# Patient Record
Sex: Female | Born: 1975 | Race: White | Hispanic: No | Smoking: Never smoker
Health system: Southern US, Community
[De-identification: ages and names within clinical notes are randomized; demographics above are authoritative.]

## PROBLEM LIST (undated history)

## (undated) DIAGNOSIS — Z8742 Personal history of other diseases of the female genital tract: Secondary | ICD-10-CM

## (undated) DIAGNOSIS — R112 Nausea with vomiting, unspecified: Secondary | ICD-10-CM

## (undated) DIAGNOSIS — I10 Essential (primary) hypertension: Secondary | ICD-10-CM

## (undated) DIAGNOSIS — Z9889 Other specified postprocedural states: Secondary | ICD-10-CM

## (undated) DIAGNOSIS — E669 Obesity, unspecified: Secondary | ICD-10-CM

## (undated) DIAGNOSIS — F419 Anxiety disorder, unspecified: Secondary | ICD-10-CM

## (undated) DIAGNOSIS — K644 Residual hemorrhoidal skin tags: Secondary | ICD-10-CM

## (undated) HISTORY — PX: MM BREAST STEREO BIOPSY LEFT (ARMC HX): HXRAD1824

## (undated) HISTORY — DX: Residual hemorrhoidal skin tags: K64.4

## (undated) HISTORY — PX: BREAST BIOPSY: SHX20

## (undated) HISTORY — DX: Essential (primary) hypertension: I10

---

## 2012-12-02 ENCOUNTER — Inpatient Hospital Stay: Payer: Self-pay | Admitting: Obstetrics & Gynecology

## 2012-12-02 LAB — CBC WITH DIFFERENTIAL/PLATELET
Basophil #: 0 10*3/uL (ref 0.0–0.1)
Eosinophil #: 0 10*3/uL (ref 0.0–0.7)
Eosinophil %: 0.3 %
HCT: 35.9 % (ref 35.0–47.0)
Lymphocyte #: 2.6 10*3/uL (ref 1.0–3.6)
Lymphocyte %: 17.6 %
MCH: 28.9 pg (ref 26.0–34.0)
MCHC: 33.1 g/dL (ref 32.0–36.0)
Neutrophil #: 11.4 10*3/uL — ABNORMAL HIGH (ref 1.4–6.5)
Platelet: 236 10*3/uL (ref 150–440)
RBC: 4.1 10*6/uL (ref 3.80–5.20)
RDW: 13.8 % (ref 11.5–14.5)
WBC: 14.8 10*3/uL — ABNORMAL HIGH (ref 3.6–11.0)

## 2012-12-03 LAB — HEMATOCRIT: HCT: 30.9 % — ABNORMAL LOW (ref 35.0–47.0)

## 2012-12-10 LAB — PATHOLOGY REPORT

## 2013-05-24 ENCOUNTER — Emergency Department: Payer: Self-pay | Admitting: Emergency Medicine

## 2013-05-24 LAB — URINALYSIS, COMPLETE
Bacteria: NONE SEEN
Bilirubin,UR: NEGATIVE
Blood: NEGATIVE
Glucose,UR: NEGATIVE mg/dL
Ketone: NEGATIVE
Leukocyte Esterase: NEGATIVE
Nitrite: NEGATIVE
Ph: 5
Protein: NEGATIVE
RBC,UR: 2 /HPF
Specific Gravity: 1.02
Squamous Epithelial: 4
WBC UR: 2 /HPF

## 2013-05-24 LAB — BASIC METABOLIC PANEL
Anion Gap: 11 (ref 7–16)
BUN: 12 mg/dL (ref 7–18)
Calcium, Total: 9.7 mg/dL (ref 8.5–10.1)
Co2: 23 mmol/L (ref 21–32)
EGFR (African American): >60
Glucose: 116 mg/dL — ABNORMAL HIGH (ref 65–99)
Osmolality: 280 (ref 275–301)
Potassium: 4.7 mmol/L (ref 3.5–5.1)

## 2013-05-24 LAB — CBC WITH DIFFERENTIAL/PLATELET
Basophil #: 0.1 x10 3/mm 3
Basophil %: 0.5 %
Eosinophil #: 0.1 x10 3/mm 3
Eosinophil %: 0.6 %
HCT: 43.2 %
HGB: 14.3 g/dL
Lymphocyte %: 38.8 %
Lymphs Abs: 5 x10 3/mm 3 — ABNORMAL HIGH
MCH: 28.1 pg
MCHC: 33.2 g/dL
MCV: 85 fL
Monocyte #: 0.7 "x10 3/mm "
Monocyte %: 5.3 %
Neutrophil #: 7.1 x10 3/mm 3 — ABNORMAL HIGH
Neutrophil %: 54.8 %
Platelet: 295 x10 3/mm 3
RBC: 5.1 X10 6/mm 3
RDW: 13.6 %
WBC: 13 x10 3/mm 3 — ABNORMAL HIGH

## 2013-05-24 LAB — HCG, QUANTITATIVE, PREGNANCY: Beta Hcg, Quant.: <1 m[IU]/mL — ABNORMAL LOW

## 2014-11-26 ENCOUNTER — Ambulatory Visit: Payer: Self-pay | Admitting: Family Medicine

## 2015-02-18 NOTE — Op Note (Signed)
PATIENT NAME:  Tonya Castro, Tonya Castro MR#:  [id number] DATE OF BIRTH:  1976/03/11  DATE OF PROCEDURE:  12/02/2012  PREOPERATIVE DIAGNOSIS:  Term intrauterine pregnancy, prior history of cesarean section, labor.   POSTOPERATIVE DIAGNOSIS:  Term intrauterine pregnancy, prior history of cesarean section, labor.  Also, abnormal right thigh nevus.   PROCEDURE:  Repeat cesarean section, excision of right thigh nevus.   SURGEON:  Annamarie MajorPaul Daenerys Buttram, M.D.   ASSISTANT:  Jean RosenthalJackson.   ANESTHESIA:  Spinal.   ESTIMATED BLOOD LOSS:  250 mL.   COMPLICATIONS:  None.   FINDINGS:  Normal tubes, ovaries and uterus, although there was adhesions in the pelvis.  There was the delivery of a viable female infant weighing 6 pounds 1 ounce with Apgar scores of 9 and 9 at 1 and 5 minutes respectively.   DISPOSITION:  To recovery room in stable condition.   TECHNIQUE:  The patient is prepped and draped in the usual sterile fashion after adequate anesthesia is obtained in the supine position on the operating room table.  A scalpel is used to create a low transverse skin incision through the area of the prior scar.  This is extended down to the level of the rectus fascia.  The rectus fascia is dissected bilaterally using Mayo scissors.  Rectus muscles are separated in the midline.  The peritoneum is penetrated although there is significant adhesions that make it difficult to develop any internal dissection or removed the uterus after delivering the baby.  The bladder is inferiorly retracted and dissected to create a flap and then a scalpel was used to create a hysterotomy incision that is then extended by blunt dissection.  The amniotomy at that point then reveals clear fluid.  The infant's head is grasped and delivered without complication and without the use of a vacuum device.  The oropharynx is suctioned and the remaining portion of the infant is delivered with clamping and cutting of the umbilical cord.   Cord blood is obtained and  the placenta is manually extracted.  The uterus is unable to be externalized.  The hysterotomy incision is closed with a running #1 Vicryl suture in a locking fashion with excellent hemostasis noted.  The area is irrigated with saline with aspiration of all fluid.  The peritoneum is closed with a Vicryl suture.  The muscles are plicated.  The On-Q device is placed in the subfascial space.  Trocars were placed through the abdomen into the subfascial space with threading of the silver soaker catheters.  These are then placed in subfascial space.  The rectus fascia is closed with 0 Maxon suture without complication and without incorporating the catheters into the incision.  Subcutaneous tissues are irrigated with warm saline and hemostasis was assured using electrocautery.  Skin is closed with a dissolvable staple and Dermabond is applied.  The catheters were stabilized with Dermabond and Steri-Strips and are flushed with 5 mL each of bupivacaine.   The right nevus is identified and grasped with an Allis clamp.  Bupivacaine is used to anesthetize the base.  Scalpel used to excise the nevus.  Hemostasis assured using electrocautery.  Defect is closed with Dermabond.  The patient goes to recovery room in stable condition.  All sponge, instrument and needle counts are correct.    ____________________________ R. Annamarie MajorPaul Mazal Ebey, MD rph:ea D: 12/02/2012 16:06:15 ET T: 12/02/2012 23:43:31 ET JOB#: [id number]  cc: Dierdre Searles. Paul Senay Sistrunk, MD, <Dictator> Nadara MustardOBERT P Allysha Tryon MD ELECTRONICALLY SIGNED 12/03/2012 3:28

## 2015-03-08 NOTE — H&P (Signed)
L&D Evaluation:  History Expanded:   HPI 39 yo G2P1 at 39 weeks in labor w bloody show and contractions since 0300. Sch for Cesarean Section Clovis Caohur as she has had prior Cesarean Section for Failure To Progress. [facility]    Gravida 2    Term 1    Blood Type (Maternal) O positive    Group B Strep Results Maternal (Result >5wks must be treated as unknown) negative    Maternal HIV Negative    Maternal Syphilis Ab Nonreactive    Maternal Varicella Immune    Rubella Results (Maternal) nonimmune    [facility]    Presents with contractions    Patient's Medical History No Chronic Illness  Obesity.    Patient's Surgical History Previous C-Section    Medications Pre Natal Vitamins    Allergies PCN, Vancomycin    Social History none    Family History Non-Contributory   ROS:   ROS All systems were reviewed.  HEENT, CNS, GI, GU, Respiratory, CV, Renal and Musculoskeletal systems were found to be normal.   Exam:   Vital Signs stable    General no apparent distress    Mental Status clear    Chest clear    Heart normal sinus rhythm    Abdomen gravid, tender with contractions    Estimated Fetal Weight Average for gestational age    Back no CVAT    Edema no edema    Reflexes 2+    Pelvic no external lesions, 3/50/-2    Mebranes Intact    FHT normal rate with no decels    Ucx regular    Skin dry   Impression:   Impression early labor, w prior Cesarean Section, desires Repeat Cesarean Section.   Plan:   Plan EFM/NST, fluids    Comments Plan Cesarean Section today, as she is in early labor and there would be no perceived benefit to waiting until Thursday (2 days from now).  Pros and cons discussed.  Pt has been fully informed of the pros and cons, risk/benefits of repeat Cesarean section versus VBAC.  The risks of a repeat Cesarean section include all the risks of major surgery, such as the risk of anesthesia,  hemorrhage, infection, injury to adjacent organs-bowel, bladder and blood vessels.  The risks of attempting a VBAC include a 1 in 100 risk of uterine rupture with resultant potentially severe fetal/maternal complications and an increased risk of Cesarean complications should she not be successful. After carefully weighing the pros and cons, the patient strongly requests a repeat Cesarean section.   Electronic Signatures: Letitia LibraHarris, Robert Paul (MD)  (Signed 04-Feb-14 12:06)  Authored: L&D Evaluation   Last Updated: 04-Feb-14 12:06 by Letitia LibraHarris, Robert Paul (MD)

## 2015-03-14 ENCOUNTER — Observation Stay
Admission: EM | Admit: 2015-03-14 | Discharge: 2015-03-15 | Disposition: A | Discharge status: Home / Self Care | Payer: BC Managed Care – PPO | Attending: Obstetrics and Gynecology | Admitting: Obstetrics and Gynecology

## 2015-03-14 ENCOUNTER — Observation Stay: Payer: BC Managed Care – PPO | Admitting: Registered Nurse

## 2015-03-14 ENCOUNTER — Emergency Department: Payer: BC Managed Care – PPO

## 2015-03-14 ENCOUNTER — Encounter
Admission: EM | Disposition: A | Discharge status: Home / Self Care | Payer: Self-pay | Source: Home / Self Care | Attending: Emergency Medicine

## 2015-03-14 ENCOUNTER — Encounter: Payer: Self-pay | Admitting: Radiology

## 2015-03-14 DIAGNOSIS — T839XXA Unspecified complication of genitourinary prosthetic device, implant and graft, initial encounter: Secondary | ICD-10-CM

## 2015-03-14 DIAGNOSIS — T8332XA Displacement of intrauterine contraceptive device, initial encounter: Secondary | ICD-10-CM | POA: Insufficient documentation

## 2015-03-14 DIAGNOSIS — Z9889 Other specified postprocedural states: Secondary | ICD-10-CM | POA: Diagnosis not present

## 2015-03-14 DIAGNOSIS — R1030 Lower abdominal pain, unspecified: Secondary | ICD-10-CM | POA: Insufficient documentation

## 2015-03-14 DIAGNOSIS — F419 Anxiety disorder, unspecified: Secondary | ICD-10-CM | POA: Diagnosis not present

## 2015-03-14 DIAGNOSIS — Z302 Encounter for sterilization: Secondary | ICD-10-CM | POA: Diagnosis not present

## 2015-03-14 DIAGNOSIS — Z6835 Body mass index (BMI) 35.0-35.9, adult: Secondary | ICD-10-CM | POA: Insufficient documentation

## 2015-03-14 DIAGNOSIS — Z79899 Other long term (current) drug therapy: Secondary | ICD-10-CM | POA: Diagnosis not present

## 2015-03-14 DIAGNOSIS — X58XXXA Exposure to other specified factors, initial encounter: Secondary | ICD-10-CM | POA: Diagnosis not present

## 2015-03-14 DIAGNOSIS — K760 Fatty (change of) liver, not elsewhere classified: Secondary | ICD-10-CM | POA: Insufficient documentation

## 2015-03-14 DIAGNOSIS — R109 Unspecified abdominal pain: Secondary | ICD-10-CM | POA: Diagnosis present

## 2015-03-14 DIAGNOSIS — E669 Obesity, unspecified: Secondary | ICD-10-CM

## 2015-03-14 DIAGNOSIS — Z881 Allergy status to other antibiotic agents status: Secondary | ICD-10-CM | POA: Insufficient documentation

## 2015-03-14 DIAGNOSIS — R102 Pelvic and perineal pain: Principal | ICD-10-CM | POA: Diagnosis present

## 2015-03-14 DIAGNOSIS — T8389XA Other specified complication of genitourinary prosthetic devices, implants and grafts, initial encounter: Secondary | ICD-10-CM | POA: Diagnosis present

## 2015-03-14 HISTORY — PX: CYSTOSCOPY: SHX5120

## 2015-03-14 HISTORY — PX: LAPAROSCOPY: SHX197

## 2015-03-14 HISTORY — PX: TUBAL LIGATION: SHX77

## 2015-03-14 HISTORY — DX: Anxiety disorder, unspecified: F41.9

## 2015-03-14 HISTORY — PX: HYSTEROSCOPY: SHX211

## 2015-03-14 HISTORY — DX: Obesity, unspecified: E66.9

## 2015-03-14 HISTORY — PX: IUD REMOVAL: SHX5392

## 2015-03-14 LAB — URINALYSIS COMPLETE WITH MICROSCOPIC (ARMC ONLY)
Bacteria, UA: NONE SEEN
Bilirubin Urine: NEGATIVE
GLUCOSE, UA: NEGATIVE mg/dL
Ketones, ur: NEGATIVE mg/dL
LEUKOCYTES UA: NEGATIVE
NITRITE: NEGATIVE
PROTEIN: NEGATIVE mg/dL
SPECIFIC GRAVITY, URINE: 1.02 (ref 1.005–1.030)
pH: 6 (ref 5.0–8.0)

## 2015-03-14 LAB — CBC WITH DIFFERENTIAL/PLATELET
Basophils Absolute: 0 10*3/uL (ref 0–0.1)
Basophils Relative: 0 %
Eosinophils Absolute: 0.1 10*3/uL (ref 0–0.7)
Eosinophils Relative: 0 %
HCT: 42.7 % (ref 35.0–47.0)
Hemoglobin: 14.1 g/dL (ref 12.0–16.0)
Lymphocytes Relative: 11 %
Lymphs Abs: 2.2 10*3/uL (ref 1.0–3.6)
MCH: 29.8 pg (ref 26.0–34.0)
MCHC: 33 g/dL (ref 32.0–36.0)
MCV: 90.3 fL (ref 80.0–100.0)
Monocytes Absolute: 0.9 10*3/uL (ref 0.2–0.9)
Monocytes Relative: 4 %
Neutro Abs: 17.1 10*3/uL — ABNORMAL HIGH (ref 1.4–6.5)
Neutrophils Relative %: 85 %
Platelets: 276 10*3/uL (ref 150–440)
RBC: 4.73 MIL/uL (ref 3.80–5.20)
RDW: 12.6 % (ref 11.5–14.5)
WBC: 20.2 10*3/uL — ABNORMAL HIGH (ref 3.6–11.0)

## 2015-03-14 LAB — BASIC METABOLIC PANEL
Anion gap: 7 (ref 5–15)
BUN: 13 mg/dL (ref 6–20)
CO2: 24 mmol/L (ref 22–32)
Calcium: 9.1 mg/dL (ref 8.9–10.3)
Chloride: 107 mmol/L (ref 101–111)
Creatinine, Ser: 0.81 mg/dL (ref 0.44–1.00)
GFR calc Af Amer: >60 mL/min (ref >60)
GFR calc non Af Amer: >60 mL/min (ref >60)
Glucose, Bld: 112 mg/dL — ABNORMAL HIGH (ref 65–99)
Potassium: 3.9 mmol/L (ref 3.5–5.1)
Sodium: 138 mmol/L (ref 135–145)

## 2015-03-14 LAB — POCT PREGNANCY, URINE: Preg Test, Ur: NEGATIVE

## 2015-03-14 LAB — WET PREP, GENITAL
Clue Cells Wet Prep HPF POC: NONE SEEN
Trich, Wet Prep: NONE SEEN
Yeast Wet Prep HPF POC: NONE SEEN

## 2015-03-14 SURGERY — LAPAROSCOPY, DIAGNOSTIC
Anesthesia: General

## 2015-03-14 MED ORDER — ACETAMINOPHEN 10 MG/ML IV SOLN
INTRAVENOUS | Status: AC
  Filled 2015-03-14: qty 100

## 2015-03-14 MED ORDER — ONDANSETRON HCL 4 MG PO TABS: 4.0000 mg | ORAL_TABLET | Freq: Four times a day (QID) | ORAL | Status: DC | PRN

## 2015-03-14 MED ORDER — ONDANSETRON HCL 4 MG/2ML IJ SOLN
INTRAMUSCULAR | Status: DC | PRN
  Administered 2015-03-14: 4 mg via INTRAVENOUS

## 2015-03-14 MED ORDER — ACETAMINOPHEN 10 MG/ML IV SOLN
INTRAVENOUS | Status: DC | PRN
  Administered 2015-03-14: 1000 mg via INTRAVENOUS

## 2015-03-14 MED ORDER — FENTANYL CITRATE (PF) 100 MCG/2ML IJ SOLN
INTRAMUSCULAR | Status: DC | PRN
  Administered 2015-03-14: 100 ug via INTRAVENOUS
  Administered 2015-03-14: 50 ug via INTRAVENOUS

## 2015-03-14 MED ORDER — ONDANSETRON HCL 4 MG/2ML IJ SOLN: 4.0000 mg | Freq: Four times a day (QID) | INTRAMUSCULAR | Status: DC | PRN

## 2015-03-14 MED ORDER — DEXAMETHASONE SODIUM PHOSPHATE 4 MG/ML IJ SOLN
INTRAMUSCULAR | Status: DC | PRN
  Administered 2015-03-14: 10 mg via INTRAVENOUS

## 2015-03-14 MED ORDER — BUPIVACAINE HCL (PF) 0.5 % IJ SOLN
INTRAMUSCULAR | Status: AC
  Filled 2015-03-14: qty 30

## 2015-03-14 MED ORDER — ROCURONIUM BROMIDE 100 MG/10ML IV SOLN
INTRAVENOUS | Status: DC | PRN
  Administered 2015-03-14: 40 mg via INTRAVENOUS

## 2015-03-14 MED ORDER — HYDROMORPHONE HCL 1 MG/ML IJ SOLN
0.2500 mg | INTRAMUSCULAR | Status: DC | PRN
  Administered 2015-03-14 (×5): 0.25 mg via INTRAVENOUS

## 2015-03-14 MED ORDER — NEOSTIGMINE METHYLSULFATE 10 MG/10ML IV SOLN
INTRAVENOUS | Status: DC | PRN
  Administered 2015-03-14: 5 mg via INTRAVENOUS

## 2015-03-14 MED ORDER — FENTANYL CITRATE (PF) 100 MCG/2ML IJ SOLN
INTRAMUSCULAR | Status: AC
  Administered 2015-03-14: 25 ug via INTRAVENOUS
  Filled 2015-03-14: qty 2

## 2015-03-14 MED ORDER — SODIUM CHLORIDE 0.9 % IJ SOLN: 9.0000 mL | INTRAMUSCULAR | Status: DC | PRN

## 2015-03-14 MED ORDER — FENTANYL CITRATE (PF) 100 MCG/2ML IJ SOLN
25.0000 ug | INTRAMUSCULAR | Status: DC | PRN
  Administered 2015-03-14 (×4): 25 ug via INTRAVENOUS

## 2015-03-14 MED ORDER — MIDAZOLAM HCL 2 MG/2ML IJ SOLN
INTRAMUSCULAR | Status: DC | PRN
  Administered 2015-03-14: 2 mg via INTRAVENOUS

## 2015-03-14 MED ORDER — PHENYLEPHRINE HCL 10 MG/ML IJ SOLN
INTRAMUSCULAR | Status: DC | PRN
  Administered 2015-03-14 (×2): 100 ug via INTRAVENOUS

## 2015-03-14 MED ORDER — OXYCODONE-ACETAMINOPHEN 5-325 MG PO TABS
1.0000 | ORAL_TABLET | ORAL | Status: DC | PRN
  Administered 2015-03-14 – 2015-03-15 (×4): 2 via ORAL
  Filled 2015-03-14 (×4): qty 2

## 2015-03-14 MED ORDER — MORPHINE SULFATE 4 MG/ML IJ SOLN
INTRAMUSCULAR | Status: AC
  Administered 2015-03-14: 4 mg via INTRAVENOUS
  Filled 2015-03-14: qty 1

## 2015-03-14 MED ORDER — ONDANSETRON HCL 4 MG/2ML IJ SOLN: 4.0000 mg | Freq: Once | INTRAMUSCULAR | Status: DC | PRN

## 2015-03-14 MED ORDER — HYDROMORPHONE HCL 1 MG/ML IJ SOLN
INTRAMUSCULAR | Status: AC
  Administered 2015-03-14: 0.25 mg via INTRAVENOUS
  Filled 2015-03-14: qty 1

## 2015-03-14 MED ORDER — DEXTROSE-NACL 5-0.45 % IV SOLN
INTRAVENOUS | Status: DC
  Administered 2015-03-14: 10:00:00 via INTRAVENOUS

## 2015-03-14 MED ORDER — IOHEXOL 300 MG/ML  SOLN
100.0000 mL | Freq: Once | INTRAMUSCULAR | Status: AC | PRN
  Administered 2015-03-14: 100 mL via INTRAVENOUS

## 2015-03-14 MED ORDER — SODIUM CHLORIDE 0.9 % IV BOLUS (SEPSIS)
1000.0000 mL | Freq: Once | INTRAVENOUS | Status: AC
  Administered 2015-03-14: 1000 mL via INTRAVENOUS

## 2015-03-14 MED ORDER — SODIUM CHLORIDE 0.9 % IR SOLN
Status: DC | PRN
  Administered 2015-03-14: 500 mL

## 2015-03-14 MED ORDER — PROPOFOL 10 MG/ML IV BOLUS
INTRAVENOUS | Status: DC | PRN
  Administered 2015-03-14: 200 mg via INTRAVENOUS

## 2015-03-14 MED ORDER — MORPHINE SULFATE 4 MG/ML IJ SOLN
4.0000 mg | Freq: Once | INTRAMUSCULAR | Status: AC
  Administered 2015-03-14: 4 mg via INTRAVENOUS

## 2015-03-14 MED ORDER — GLYCOPYRROLATE 0.2 MG/ML IJ SOLN
INTRAMUSCULAR | Status: DC | PRN
  Administered 2015-03-14: .8 mg via INTRAVENOUS

## 2015-03-14 MED ORDER — LIDOCAINE HCL (CARDIAC) 20 MG/ML IV SOLN
INTRAVENOUS | Status: DC | PRN
  Administered 2015-03-14: 100 mg via INTRAVENOUS

## 2015-03-14 MED ORDER — MORPHINE SULFATE (PF) 1 MG/ML IV SOLN
INTRAVENOUS | Status: DC
  Administered 2015-03-14: 10:00:00 via INTRAVENOUS
  Filled 2015-03-14: qty 25

## 2015-03-14 MED ORDER — LACTATED RINGERS IV SOLN
INTRAVENOUS | Status: DC | PRN
  Administered 2015-03-14: 13:00:00 via INTRAVENOUS

## 2015-03-14 MED ORDER — LEVOFLOXACIN IN D5W 500 MG/100ML IV SOLN
500.0000 mg | INTRAVENOUS | Status: DC
  Filled 2015-03-14 (×2): qty 100

## 2015-03-14 MED ORDER — LEVOFLOXACIN IN D5W 500 MG/100ML IV SOLN
INTRAVENOUS | Status: AC
  Administered 2015-03-14: 500 mg via INTRAVENOUS
  Filled 2015-03-14: qty 100

## 2015-03-14 MED ORDER — BUPIVACAINE HCL 0.5 % IJ SOLN
INTRAMUSCULAR | Status: DC | PRN
  Administered 2015-03-14: 15 mL

## 2015-03-14 MED ORDER — NALOXONE HCL 0.4 MG/ML IJ SOLN: 0.4000 mg | INTRAMUSCULAR | Status: DC | PRN

## 2015-03-14 MED ORDER — DIPHENHYDRAMINE HCL 12.5 MG/5ML PO ELIX
12.5000 mg | ORAL_SOLUTION | Freq: Four times a day (QID) | ORAL | Status: DC | PRN
  Filled 2015-03-14: qty 5

## 2015-03-14 MED ORDER — MORPHINE SULFATE 2 MG/ML IJ SOLN
2.0000 mg | Freq: Once | INTRAMUSCULAR | Status: AC
  Administered 2015-03-14: 2 mg via INTRAVENOUS

## 2015-03-14 MED ORDER — DIPHENHYDRAMINE HCL 50 MG/ML IJ SOLN: 12.5000 mg | Freq: Four times a day (QID) | INTRAMUSCULAR | Status: DC | PRN

## 2015-03-14 MED ORDER — HYDROMORPHONE HCL 1 MG/ML IJ SOLN
1.0000 mg | INTRAMUSCULAR | Status: DC | PRN
  Administered 2015-03-14: 1 mg via INTRAVENOUS
  Filled 2015-03-14: qty 1

## 2015-03-14 MED ORDER — IBUPROFEN 600 MG PO TABS
600.0000 mg | ORAL_TABLET | Freq: Four times a day (QID) | ORAL | Status: DC | PRN
  Administered 2015-03-14 – 2015-03-15 (×2): 600 mg via ORAL
  Filled 2015-03-14 (×2): qty 1

## 2015-03-14 MED ORDER — IOHEXOL 240 MG/ML SOLN
25.0000 mL | Freq: Once | INTRAMUSCULAR | Status: AC | PRN
  Administered 2015-03-14: 25 mL via ORAL

## 2015-03-14 MED ORDER — MORPHINE SULFATE 2 MG/ML IJ SOLN
INTRAMUSCULAR | Status: AC
  Filled 2015-03-14: qty 1

## 2015-03-14 SURGICAL SUPPLY — 41 items
BLADE SURG SZ11 CARB STEEL (BLADE) ×4 IMPLANT
CANISTER SUCT 1200ML W/VALVE (MISCELLANEOUS) ×4 IMPLANT
CHLORAPREP W/TINT 26ML (MISCELLANEOUS) ×4 IMPLANT
CLIP FILSHIE TUBAL LIGA STRL (Clip) ×8 IMPLANT
DERMABOND ADVANCED (GAUZE/BANDAGES/DRESSINGS) ×2
DERMABOND ADVANCED .7 DNX12 (GAUZE/BANDAGES/DRESSINGS) ×2 IMPLANT
DRAPE LEGGINS SURG 28X43 STRL (DRAPES) ×4 IMPLANT
DRAPE SHEET LG 3/4 BI-LAMINATE (DRAPES) ×4 IMPLANT
DRAPE UNDER BUTTOCK W/FLU (DRAPES) ×4 IMPLANT
GLOVE BIO SURGEON STRL SZ7 (GLOVE) ×4 IMPLANT
GLOVE BIOGEL PI IND STRL 7.5 (GLOVE) ×2 IMPLANT
GLOVE BIOGEL PI INDICATOR 7.5 (GLOVE) ×2
GOWN STRL REUS W/ TWL LRG LVL3 (GOWN DISPOSABLE) ×4 IMPLANT
GOWN STRL REUS W/TWL LRG LVL3 (GOWN DISPOSABLE) ×4
IRRIGATION STRYKERFLOW (MISCELLANEOUS) ×2 IMPLANT
IRRIGATOR STRYKERFLOW (MISCELLANEOUS) ×4
IV LACTATED RINGERS 1000ML (IV SOLUTION) IMPLANT
JELLY LUB 2OZ STRL (MISCELLANEOUS) ×2
JELLY LUBE 2OZ STRL (MISCELLANEOUS) ×2 IMPLANT
KIT RM TURNOVER CYSTO AR (KITS) ×4 IMPLANT
LABEL OR SOLS (LABEL) ×4 IMPLANT
LIQUID BAND (GAUZE/BANDAGES/DRESSINGS) ×4 IMPLANT
NDL SAFETY 22GX1.5 (NEEDLE) ×4 IMPLANT
NS IRRIG 500ML POUR BTL (IV SOLUTION) ×4 IMPLANT
PACK LAP CHOLECYSTECTOMY (MISCELLANEOUS) ×4 IMPLANT
PAD GROUND ADULT SPLIT (MISCELLANEOUS) ×4 IMPLANT
PAD OB MATERNITY 4.3X12.25 (PERSONAL CARE ITEMS) ×4 IMPLANT
PAD PREP 24X41 OB/GYN DISP (PERSONAL CARE ITEMS) ×4 IMPLANT
SCISSORS METZENBAUM CVD 33 (INSTRUMENTS) ×4 IMPLANT
SHEARS HARMONIC ACE PLUS 36CM (ENDOMECHANICALS) IMPLANT
SLEEVE ENDOPATH XCEL 5M (ENDOMECHANICALS) ×84 IMPLANT
SOL PREP PVP 2OZ (MISCELLANEOUS) ×4
SOLUTION PREP PVP 2OZ (MISCELLANEOUS) ×2 IMPLANT
SUT MNCRL 4-0 (SUTURE) ×2
SUT MNCRL 4-0 27XMFL (SUTURE) ×2
SUT VIC AB 2-0 UR6 27 (SUTURE) ×4 IMPLANT
SUTURE MNCRL 4-0 27XMF (SUTURE) ×2 IMPLANT
TROCAR ENDO BLADELESS 11MM (ENDOMECHANICALS) ×4 IMPLANT
TROCAR XCEL NON-BLD 5MMX100MML (ENDOMECHANICALS) ×4 IMPLANT
TROCAR XCEL UNIV SLVE 11M 100M (ENDOMECHANICALS) ×8 IMPLANT
TUBING INSUFFLATOR HI FLOW (MISCELLANEOUS) ×4 IMPLANT

## 2015-03-14 NOTE — H&P (Cosign Needed)
Obstetrics & Gynecology Consult H&P    Consulting Department: Emergency  Consulting Physician: Charlesetta Ivory MD  Consulting Question: Perforated IUD   History of Present Illness: Patient is a 39 y.o. [id number] presenting with acute onset midline lower abdominal pain, onset yesterday evening.  The patient ate at 18:30 yesterday evening, onset of pain later that night, was unable to sleep.  8/10 at its worst, now 3/10 after morphine.  She denies nausea, emesis, fevers, chills.  She has not experienced any vaginal bleeding or discharge.  IUD was placed 01/14/2013 postpartum.  She did have some abdominal pain in December of 2015 with ultrasound at that time showing IUD in LUS with arms not clearly visualized.    Review of Systems:10 point review of systems  Past Medical History:  1) Migraine 2) Anxiety  Past Surgical History:  1) C-section 2) Left breast biopsy  Gynecologic History: No history of STI, pap 04/24/2012 normal with negative HPV  Obstetric History: [id number]  G1: 12/27/2008 40 week, C-section 7lbs 6oz female G2: 12/02/2012 39 repeat C-section 6lbs 1oz  Family History:  Non-contributory  Social History:  History   Social History  . Marital Status: Married    Spouse Name: N/A  . Number of Children: N/A  . Years of Education: N/A   Occupational History  . Not on file.   Social History Main Topics  . Smoking status: Not on file  . Smokeless tobacco: Not on file  . Alcohol Use: Not on file  . Drug Use: Not on file  . Sexual Activity: Not on file   Other Topics Concern  . Not on file   Social History Narrative  . No narrative on file    Allergies:  Allergies  Allergen Reactions  . Amoxicillin Swelling  . Vancomycin     Red man syndrome    Medications: Prior to Admission medications   Medication Sig Start Date End Date Taking? Authorizing Provider  desvenlafaxine (PRISTIQ) 50 MG 24 hr tablet Take 50 mg by mouth daily.   Yes Historical Provider, MD     Physical Exam Vitals: Blood pressure 126/81, pulse 88, temperature 98.1 F (36.7 C), temperature source Oral, resp. rate 20, height _0  (1.651 m), weight 95.255 kg (210 lb), SpO2 96 %. General:NAD HEENT: Normocephalic, anicteric Pulmonary: CTAB Cardiovascular: RRR  Abdomen: NABS, soft, non-distended, tender bilateral lower quadrants and midline, mild rebound present Genitourinary: deferred  Extremities: no erythema, no tenderness Neurologic: grossly no deficits Psychiatric: A&0, affect full, mood appropriate  Labs: Results for  orders placed or performed during the hospital encounter of 03/14/15 (from the past 72 hour(s))  Pregnancy, urine POC     Status: None   Collection Time: 03/14/15  1:47 AM  Result Value Ref Range   Preg Test, Ur NEGATIVE NEGATIVE    Comment:        THE SENSITIVITY OF THIS METHODOLOGY IS >24 mIU/mL   Urinalysis complete, with microscopic [facility])     Status: Abnormal   Collection Time: 03/14/15  2:12 AM  Result Value Ref Range   Color, Urine YELLOW (A) YELLOW   APPearance CLEAR (A) CLEAR   Glucose, UA NEGATIVE NEGATIVE mg/dL   Bilirubin Urine NEGATIVE NEGATIVE   Ketones, ur NEGATIVE NEGATIVE mg/dL   Specific Gravity, Urine 1.020 1.005 - 1.030   Hgb urine dipstick 1+ (A) NEGATIVE   pH 6.0 5.0 - 8.0   Protein, ur NEGATIVE NEGATIVE mg/dL   Nitrite NEGATIVE NEGATIVE   Leukocytes, UA NEGATIVE NEGATIVE  RBC / HPF 0-5 0 - 5 RBC/hpf   WBC, UA 0-5 0 - 5 WBC/hpf   Bacteria, UA NONE SEEN NONE SEEN   Squamous Epithelial / LPF 0-5 (A) NONE SEEN   Mucous PRESENT   CBC WITH DIFFERENTIAL     Status: Abnormal   Collection Time: 03/14/15  2:15 AM  Result Value Ref Range   WBC 20.2 (H) 3.6 - 11.0 K/uL   RBC 4.73 3.80 - 5.20 MIL/uL   Hemoglobin 14.1 12.0 - 16.0 g/dL   HCT 42.7 35.0 - 47.0 %   MCV 90.3 80.0 - 100.0 fL   MCH 29.8 26.0 - 34.0 pg   MCHC 33.0 32.0 - 36.0 g/dL   RDW 12.6 11.5 - 14.5 %   Platelets 276 150 - 440 K/uL   Neutrophils Relative % 85  %   Neutro Abs 17.1 (H) 1.4 - 6.5 K/uL   Lymphocytes Relative 11 %   Lymphs Abs 2.2 1.0 - 3.6 K/uL   Monocytes Relative 4 %   Monocytes Absolute 0.9 0.2 - 0.9 K/uL   Eosinophils Relative 0 %   Eosinophils Absolute 0.1 0 - 0.7 K/uL   Basophils Relative 0 %   Basophils Absolute 0.0 0 - 0.1 K/uL  Basic metabolic panel     Status: Abnormal   Collection Time: 03/14/15  2:15 AM  Result Value Ref Range   Sodium 138 135 - 145 mmol/L   Potassium 3.9 3.5 - 5.1 mmol/L   Chloride 107 101 - 111 mmol/L   CO2 24 22 - 32 mmol/L   Glucose, Bld 112 (H) 65 - 99 mg/dL   BUN 13 6 - 20 mg/dL   Creatinine, Ser 0.81 0.44 - 1.00 mg/dL   Calcium 9.1 8.9 - 10.3 mg/dL   GFR calc non Af Amer >60 >60 mL/min   GFR calc Af Amer >60 >60 mL/min    Comment: (NOTE) The eGFR has been calculated using the CKD EPI equation. This calculation has not been validated in all clinical situations. eGFR's persistently <60 mL/min signify possible Chronic Kidney Disease.    Anion gap 7 5 - 15  Wet prep, genital     Status: Abnormal   Collection Time: 03/14/15  6:47 AM  Result Value Ref Range   Yeast Wet Prep HPF POC NONE SEEN NONE SEEN    Comment: SPECIMEN SUBMITTED ON DRY SWAB. INTERPRET RESULTS WITH CAUTION.   Trich, Wet Prep NONE SEEN NONE SEEN   Clue Cells Wet Prep HPF POC NONE SEEN NONE SEEN   WBC, Wet Prep HPF POC MANY (A) NONE SEEN    Imaging Ct Abdomen Pelvis W Contrast  03/14/2015   CLINICAL DATA:  Mid to lower abdominal pain for 12 hours.  EXAM: CT ABDOMEN AND PELVIS WITH CONTRAST  TECHNIQUE: Multidetector CT imaging of the abdomen and pelvis was performed using the standard protocol following bolus administration of intravenous contrast.  CONTRAST:  192m OMNIPAQUE IOHEXOL 300 MG/ML  SOLN  COMPARISON:  CT 05/24/2013.  FINDINGS: The included lung bases are clear.  There is mild hepatic steatosis without focal hepatic lesion. The gallbladder is physiologically distended. Scattered splenic granulomas. Pancreas and  adrenal glands are normal. Kidneys demonstrate symmetric enhancement without hydronephrosis or perinephric stranding.  The stomach is physiologically distended. There are no dilated or thickened bowel loops. The appendix is tentatively identified and normal. There is a small to moderate volume of colonic stool. No colonic wall thickening. No free air, free fluid, or intra-abdominal fluid collection.  Abdominal aorta is normal in caliber. No retroperitoneal adenopathy.  There is an intrauterine device in the endometrial canal, however appears low in position. One of the limbs of the intrauterine device appears to extend just beyond the myometrium and is concerning for a IUD perforation, and abuts the posterior wall of the urinary bladder. No bladder wall thickening, bladder is physiologically distended. There is no adnexal mass. No pelvic free fluid. No pelvic adenopathy.  There are no acute or suspicious osseous abnormalities. Bone island seen in L3.  IMPRESSION: 1. Malpositioned intrauterine device, with 1 arm likely extending beyond the myometrium anteriorly, concerning for IUD perforation. GYN consultation recommended. 2. Incidental note of hepatic steatosis. These results were called by telephone at the time of interpretation on 03/14/2015 at 6:27 am to Dr. Charlesetta Ivory , who verbally acknowledged these results.   Electronically Signed   By: Jeb Levering M.D.   On: 03/14/2015 06:27    Assessment: 39 y.o. [id number] with perforated IUD  Plan: 1) IUD perforation - will consent for diagnostic laparoscopy and cystoscopy, IUD removal +/- laparoscopic BTL - morphine PCA until then - had oral contrast this AM  2) Elevated white count - will start on levofloxacin  3) Disposition - likely home postop

## 2015-03-14 NOTE — ED Provider Notes (Signed)
[facility] Emergency Department Provider Note  ____________________________________________  Time seen: Approximately 430 AM  I have reviewed the triage vital signs and the nursing notes.   HISTORY  Chief Complaint Abdominal Pain   HPI Tonya Castro is a 39 y.o. female comes in with lower abdominal pain. The patient reports that the pain is all across her lower abdomen. She reports that she should have and this pain after dinner. She thought it was gas initially. She reports is took a shower and went to bed and the pain was so bad that she couldn't sleep. The patient did not take anything for pain. She reports nausea with no vomiting denies fever or diarrhea. She reports that she does have a history of constipation but that is not something new to her. The patient reports her last bowel movement was this morning. The patient has never had this pain before, denies any urinary symptoms or vaginal discharge. Currently she reports her pain as a 4 out of 10 in intensity. He reports that the pain is still in her lower abdomen and does not feel worse on the right or left side. It is sharp pain.   History reviewed. No pertinent past medical history.  There are no active problems to display for this patient.   No past surgical history on file.  Current Outpatient Rx  Name  Route  Sig  Dispense  Refill  . desvenlafaxine (PRISTIQ) 50 MG 24 hr tablet   Oral   Take 50 mg by mouth daily.           Allergies Amoxicillin and Vancomycin  No family history on file.  Social History History  Substance Use Topics  . Smoking status: Not on file  . Smokeless tobacco: Not on file  . Alcohol Use: Not on file    Review of Systems Constitutional: No fever/chills Eyes: No visual changes. ENT: No sore throat. Cardiovascular: Denies chest pain. Respiratory: Denies shortness of breath. Gastrointestinal: abdominal pain.  nausea, no vomiting.  No diarrhea.   constipation. Genitourinary: Negative for dysuria. Musculoskeletal: Negative for back pain. Skin: Negative for rash. Neurological: Negative for headaches, focal weakness or numbness.  10-point ROS otherwise negative.  ____________________________________________   PHYSICAL EXAM:  VITAL SIGNS: ED Triage Vitals  Enc Vitals Group     BP 03/14/15 0132 136/92 mmHg     Pulse Rate 03/14/15 0132 123     Resp 03/14/15 0132 20     Temp 03/14/15 0132 98.1 F (36.7 C)     Temp Source 03/14/15 0132 Oral     SpO2 03/14/15 0132 98 %     Weight 03/14/15 0132 210 lb (95.255 kg)     Height 03/14/15 0132 5\' 5"  (1.651 m)     Head Cir --      Peak Flow --      Pain Score 03/14/15 0154 5     Pain Loc --      Pain Edu? --      Excl. in GC? --     Constitutional: Alert and oriented. Moderate distress, well-appearing Eyes: Conjunctivae are normal. PERRL. EOMI. Head: Atraumatic. Nose: No congestion/rhinnorhea. Mouth/Throat: Mucous membranes are moist.  Oropharynx non-erythematous. Cardiovascular: Tachycardia rrhythm. Grossly normal heart sounds.  Good peripheral circulation. Respiratory: Normal respiratory effort.  No retractions. Lungs CTAB. Gastrointestinal: Soft tenderness in lower abdomen to palpation. No distention. Positive bowel sounds Genitourinary: External genitalia unremarkable, moderate vaginal discharge, cervical os closed with tenderness over the uterus to palpation  on bimanual exam. Also adnexal tenderness bilaterally. Musculoskeletal: No lower extremity tenderness nor edema.  No joint effusions. Neurologic:  Normal speech and language. No gross focal neurologic deficits are appreciated. Speech is normal. No gait instability. Skin:  Skin is warm, dry and intact. No rash noted. Psychiatric: Mood and affect are normal. Speech and behavior are normal.  ____________________________________________   LABS (all labs ordered are listed, but only abnormal results are displayed)  Labs  Reviewed  URINALYSIS COMPLETEWITH MICROSCOPIC ([facility])  - Abnormal; Notable for the following:    Color, Urine YELLOW (*)    APPearance CLEAR (*)    Hgb urine dipstick 1+ (*)    Squamous Epithelial / LPF 0-5 (*)    All other components within normal limits  CBC WITH DIFFERENTIAL/PLATELET - Abnormal; Notable for the following:    WBC 20.2 (*)    Neutro Abs 17.1 (*)    All other components within normal limits  BASIC METABOLIC PANEL - Abnormal; Notable for the following:    Glucose, Bld 112 (*)    All other components within normal limits  WET PREP, GENITAL  POCT PREGNANCY, URINE  POC URINE PREG, ED   ____________________________________________  EKG  ED ECG REPORT   Date: 03/14/2015  EKG Time: 144  Rate: 106  Rhythm: sinus tachycardia  Axis: Normal  Intervals:none  ST&T Change: None  ____________________________________________  RADIOLOGY  CT abdomen pelvis: Malpositioned intrauterine device with 1 arm likely extending beyond the myometrium anteriorly concerning for IUD perforation, incidental note of hepatic steatosis  ____________________________________________   PROCEDURES  Procedure(s) performed: None  Critical Care performed: No  ____________________________________________   INITIAL IMPRESSION / ASSESSMENT AND PLAN / ED COURSE  Pertinent labs & imaging results that were available during my care of the patient were reviewed by me and considered in my medical decision making (see chart for details).  This is 39 year old female who comes in with lower abdominal pain that started yesterday. According to the CT scan appears as though the patient may have a perforation of her intrauterine device. She did receive morphine 4 mg IV 1 as well as 1 L normal saline IV 1. I will contact her OB/GYN's have them evaluate the patient.  ----------------------------------------- 7:23 AM on 03/14/2015 -----------------------------------------  I contacted Dr. Chauncey CruelStabler  and informed him of the results of the patient's CT scan. OB/GYN will come and evaluate the patient. ____________________________________________   FINAL CLINICAL IMPRESSION(S) / ED DIAGNOSES  Final diagnoses:  Abdominal pain  IUD perforation       Rebecka ApleyAllison P Konor Noren, MD 03/14/15 [phone]

## 2015-03-14 NOTE — Op Note (Signed)
Operative Report   Pre-Op Diagnosis:  1) Acute pelvic pain,  2) malpositioned Intrauterine Device with suspected uterine perforation,  3) desire for permanent sterility  Post-Op Diagnosis:  1) Acute pelvic pain,  2) malpositioned Intruterine Device with no evidence of uterine perforation,  3) desire for permanent sterility  Procedures:  1. Diagnostic laparoscopy 2. Bilateral tubal ligation, using Filshie clips 3. Removal of intrauterine device 4. Diagnostic hysteroscopy 5. Diagnostic cystoscopy  Primary Surgeon: Dr. Thomasene MohairStephen Joni Colegrove   EBL: 5  ml  IVF: 600  mL Urine output: 100  ml Specimens: None  Drains: None   Complications: None  Disposition: PACU   Condition: Stable   Findings:   1) Dense adhesions of omentum to anterior abdominal wall 2) dense adhesions of uterus to anterior abdominal wall 3) normal-appearing fallopian tubes bilaterally 4) normal-appearing uterine cavity without evidence of perforation 5) normal-appearing bladder without evidence of perforation on cystoscopy   Procedure Summary:  The patient was taken to the operating room where general anesthesia was administered and found to be adequate. She was placed in the dorsal supine position with lithotomy using Allen stirrups. Great care was taken to position the patient such as to minimize risk to vulnerable nerves. After a timeout was called, in and out catheterization was performed for return of 100 mL clear urine. A sterile speculum was placed in the vagina and a single tooth tenaculum was affixed to the anterior lip of the cervix. Of note, the IUD strings were visible at this point. A uterine acorn manipulator was affixed to the single-tooth tenaculum. The speculum was removed.  Attention was turned to the abdomen where after injection of local anesthetic, a 5 mm infraumbilical incision was made with the scalpel. Entry was gained into the abdomen using direct visualization through the port and using the  camera. Verification of entry into the abdominal cavity was undertaken by abdominal pressure. The abdomen was insufflated with CO2. The camera was introduced through the port and atraumatic entry was verified. A survey of the pelvis was undertaken with the above-noted findings. Given the density and amount of adhesions anteriorly, an 11 mm left lateral lower quadrant port was placed under direct intra-abdominal camera visualization without difficulty. Both fallopian tubes were easily visible and therefore were easy to place the Filshie clips with verification that the clip crossed the entire width of the fallopian tubes. The abdomen was then desufflated of CO2.  Attention was turned to the removal of the IUD. The acorn uterine manipulator was removed. The IUD strings were gently grasped with ring forceps and with gentle traction the IUD was removed without difficulty. The hysteroscopy portion of the case was then undertaken. The cervix was dilated gently in a serial fashion. The hysteroscope was gently introduced into the uterine cavity with the above-noted findings. The single-tooth tenaculum was removed from the cervix with hemostasis noted.  The cystoscopy portion of the case was next undertaken. The cystoscope was gently introduced through the urethra and the bladder was filled with sterile saline with about 200 mL. No defects in the bladder were noted and efflux from each of the ureteral orifices was noted. The bladder was then drained.  Attention was turned to the closure of the abdominal portion of the case. Each of the 2 incisions were closed in the subcutaneous tissue using 4-0 Monocryl. In the left lower quadrant the port site skin closure was undertaken using 4-0 Monocryl and reinforced using Dermabond. The umbilical incision was closed using Dermabond.  Sponge,  lap, needle, and instrument counts were correct x 2.  VTE prophylaxis: the patient was wearing SCDs. Antibiotic prophylaxis was  administered using Levaquin.  The patient was awakened in the operating room and was taken to the PACU in stable condition.   Conard NovakStephen D. Tajai Suder, MD, FACOG 03/14/2015 2:44 PM

## 2015-03-14 NOTE — ED Notes (Signed)
Patient transported to CT 

## 2015-03-14 NOTE — Transfer of Care (Signed)
  Immediate Anesthesia Transfer of Care Note  Patient: Francie MassingKara Jo Berneking  Procedure(s) Performed: Procedure(s): LAPAROSCOPY DIAGNOSTIC (N/A) BILATERAL TUBAL LIGATION (Bilateral) HYSTEROSCOPY (N/A) CYSTOSCOPY (N/A) INTRAUTERINE DEVICE (IUD) REMOVAL (N/A)  Patient Location: PACU  Anesthesia Type:General  Level of Consciousness: sedated  Airway & Oxygen Therapy: Patient Spontanous Breathing and Patient connected to face mask oxygen  Post-op Assessment: Report given to RN and Post -op Vital signs reviewed and stable  Post vital signs: Reviewed and stable  Last Vitals:  Filed Vitals:   03/14/15 1439  BP: 126/79  Pulse: 92  Temp: 37.3 C  Resp: 15    Complications: No apparent anesthesia complications

## 2015-03-14 NOTE — Anesthesia Postprocedure Evaluation (Signed)
  Anesthesia Post-op Note  Patient: Tonya Castro  Procedure(s) Performed: Procedure(s): LAPAROSCOPY DIAGNOSTIC (N/A) BILATERAL TUBAL LIGATION (Bilateral) HYSTEROSCOPY (N/A) CYSTOSCOPY (N/A) INTRAUTERINE DEVICE (IUD) REMOVAL (N/A)  Anesthesia type:General  Patient location: PACU  Post pain: Pain level controlled  Post assessment: Post-op Vital signs reviewed, Patient's Cardiovascular Status Stable, Respiratory Function Stable, Patent Airway and No signs of Nausea or vomiting  Post vital signs: Reviewed and stable  Last Vitals:  Filed Vitals:   03/14/15 1439  BP: 126/79  Pulse: 92  Temp: 37.3 C  Resp: 15    Level of consciousness: awake, alert  and patient cooperative  Complications: No apparent anesthesia complications

## 2015-03-14 NOTE — OR Nursing (Signed)
IUD was removed, picture taken of IUD and placed on chart

## 2015-03-14 NOTE — ED Notes (Signed)
Pt requested pain meds, adm md paged

## 2015-03-14 NOTE — ED Notes (Signed)
Pt c/o low midline abd pain since 7pm last night; denies dysuria; normal bowel movement today; pt says she's feeling hot; afebrile in triage

## 2015-03-14 NOTE — Anesthesia Preprocedure Evaluation (Addendum)
Anesthesia Evaluation  Patient identified by MRN, date of birth, ID band Patient awake    Reviewed: Allergy & Precautions, H&P , NPO status , Patient's Chart, lab work & pertinent test results  Airway Mallampati: III  TM Distance: >3 FB Neck ROM: full    Dental  (+) Teeth Intact   Pulmonary          Cardiovascular     Neuro/Psych PSYCHIATRIC DISORDERS Anxiety Depressionnegative neurological ROS     GI/Hepatic   Endo/Other  Morbid obesity  Renal/GU   negative genitourinary   Musculoskeletal   Abdominal   Peds  Hematology   Anesthesia Other Findings   Reproductive/Obstetrics                            Anesthesia Physical Anesthesia Plan  ASA: III  Anesthesia Plan: General   Post-op Pain Management:    Induction: Intravenous  Airway Management Planned: Oral ETT  Additional Equipment:   Intra-op Plan:   Post-operative Plan:   Informed Consent: I have reviewed the patients History and Physical, chart, labs and discussed the procedure including the risks, benefits and alternatives for the proposed anesthesia with the patient or authorized representative who has indicated his/her understanding and acceptance.     Plan Discussed with: CRNA  Anesthesia Plan Comments:        Anesthesia Quick Evaluation

## 2015-03-14 NOTE — Plan of Care (Signed)
After reviewing patient's chart and results and hospital course up to this point, plan to take patient to OR for diagnostic laparoscopy, BTL, removal of IUD and possible need for hysteroscopy and cystoscopy discussed.  Patient and husband agree to planned procedures with their attendant risks and benefits discussed.   She is 100% sure she wants no more children and understands that a BTL is not reversible. She also understands that there is a failure rate of BTL of about 3-4 in every 1,000, with an increase in risk of ectopic should pregnancy occur.

## 2015-03-14 NOTE — ED Notes (Signed)
Called to triage 2 by ed tech janet; pt became pale, diaphoretic and dizzy after blood draw; assisted to stretcher in room; laying flat; pt says this has never happened to her before with blood draw; will monitor pt before moving from room

## 2015-03-14 NOTE — Anesthesia Procedure Notes (Addendum)
Procedure Name: Intubation Date/Time: 03/14/2015 1:30 PM Performed by: Stormy FabianURTIS, LINDA Pre-anesthesia Checklist: Patient identified, Patient being monitored, Emergency Drugs available and Suction available Patient Re-evaluated:Patient Re-evaluated prior to inductionOxygen Delivery Method: Circle System Utilized Preoxygenation: Pre-oxygenation with 100% oxygen Intubation Type: IV induction Ventilation: Mask ventilation without difficulty Laryngoscope Size: Mac and 3 Grade View: Grade I Tube type: Oral Tube size: 7.0 mm Number of attempts: 1 Airway Equipment and Method: Stylet Placement Confirmation: ETT inserted through vocal cords under direct vision,  positive ETCO2 and breath sounds checked- equal and bilateral Secured at: 21 cm Tube secured with: Tape Dental Injury: Teeth and Oropharynx as per pre-operative assessment  Difficulty Due To: Difficulty was unanticipated

## 2015-03-14 NOTE — ED Notes (Signed)
Pt declines needs at this time. Pt resting in bed in no acute distress.

## 2015-03-14 NOTE — ED Notes (Signed)
Pt resting quietly in subwait recliner, waiting for treatment room;

## 2015-03-14 NOTE — Progress Notes (Signed)
Peri pad intact entire stay in pacu

## 2015-03-14 NOTE — ED Notes (Signed)
Report called to unit. 

## 2015-03-14 NOTE — ED Notes (Signed)
Pt's color returned to pink; says feeling much better; only c/o now is pain which she presented with; offered pain medication which pt declined at this time; assisted pt to subwait recliner; steady gait; warm blanket provided

## 2015-03-15 ENCOUNTER — Encounter: Payer: Self-pay | Admitting: Obstetrics and Gynecology

## 2015-03-15 LAB — CBC
HCT: 39.4 % (ref 35.0–47.0)
Hemoglobin: 12.9 g/dL (ref 12.0–16.0)
MCH: 29.7 pg (ref 26.0–34.0)
MCHC: 32.6 g/dL (ref 32.0–36.0)
MCV: 91 fL (ref 80.0–100.0)
Platelets: 283 10*3/uL (ref 150–440)
RBC: 4.34 MIL/uL (ref 3.80–5.20)
RDW: 12.5 % (ref 11.5–14.5)
WBC: 16.3 10*3/uL — ABNORMAL HIGH (ref 3.6–11.0)

## 2015-03-15 MED ORDER — IBUPROFEN 600 MG PO TABS: 600.0000 mg | ORAL_TABLET | Freq: Four times a day (QID) | ORAL | Status: DC | PRN

## 2015-03-15 MED ORDER — OXYCODONE-ACETAMINOPHEN 5-325 MG PO TABS: 2.0000 | ORAL_TABLET | ORAL | Status: DC | PRN

## 2015-03-15 NOTE — Discharge Instructions (Signed)
Laparoscopic Tubal Ligation °Care After °Refer to this sheet in the next few weeks. These instructions provide you with information on caring for yourself after your procedure. Your caregiver may also give you more specific instructions. Your treatment has been planned according to current medical practices, but problems sometimes occur. Call your caregiver if you have any problems or questions after your procedure. °HOME CARE INSTRUCTIONS  °· Rest the remainder of the day. °· Only take over-the-counter or prescription medicines for pain, discomfort, or fever as directed by your caregiver. Do not take aspirin. It can cause bleeding. °· Gradually resume daily activities, diet, rest, driving, and work. °· Avoid sexual intercourse for 2 weeks or as directed. °· Do not use tampons or douche. °· Do not drive while taking pain medicine. °· Do not lift anything over 5 pounds for 2 weeks or as directed. °· Only take showers, not baths, until you are seen by your caregiver. °· Change bandages (dressings) as directed. °· Take your temperature twice a day and record it. °· Try to have help for the first 7 to 10 days for your household needs. °· Return to your caregiver to get your stitches (sutures) removed and for follow-up visits as directed. °SEEK MEDICAL CARE IF:  °· You have redness, swelling, or increasing pain in a wound. °· You have drainage from a wound lasting longer than 1 day. °· Your pain is getting worse. °· You have a rash. °· You become dizzy or lightheaded. °· You have a reaction to your medicine. °· You need stronger medicine or a change in your pain medicine. °· You notice a bad smell coming from a wound or dressing. °· Your wound breaks open after the sutures have been removed. °· You are constipated. °SEEK IMMEDIATE MEDICAL CARE IF:  °· You faint. °· You have a fever. °· You have increasing abdominal pain. °· You have severe pain in your shoulders. °· You have bleeding or drainage from the suture sites or  vagina following surgery. °· You have shortness of breath or difficulty breathing. °· You have chest or leg pain. °· You have persistent nausea, vomiting, or diarrhea. °MAKE SURE YOU:  °· Understand these instructions. °· Watch your condition. °· Get help right away if you are not doing well or get worse. °Document Released: 05/04/2005 Document Revised: 04/15/2012 Document Reviewed: 01/26/2012 °ExitCare® Patient Information ©2015 [employer]. This information is not intended to replace advice given to you by your health care provider. Make sure you discuss any questions you have with your health care provider. ° °

## 2015-03-15 NOTE — Discharge Summary (Signed)
Discharge Summary   Patient ID: Tonya Castro [id number] 39 y.o. Apr 14, 1976  Admit date: 03/14/2015  Discharge date: 03/15/2015  Principal Diagnoses:  1) acute pelvic pain 2) malpositioned Intrauterine Device 3) Desire for permanent sterility  Secondary Diagnoses:  None  Procedures performed during the hospitalization:  1) diagnostic laparoscopy (03/14/15) 2) laparoscopic bilateral tubal ligation (03/14/15) 3) hysteroscopy (03/14/2015) 4) cystoscopy (03/14/2015) 5) postoperative management of severe pain  HPI: Patient is a 39 y.o. [id number] presenting with acute onset midline lower abdominal pain, onset yesterday evening. The patient ate at 18:30 yesterday evening, onset of pain later that night, was unable to sleep. 8/10 at its worst, now 3/10 after morphine. She denies nausea, emesis, fevers, chills. She has not experienced any vaginal bleeding or discharge. IUD was placed 01/14/2013 postpartum. She did have some abdominal pain in December of 2015 with ultrasound at that time showing IUD in LUS with arms not clearly visualized.   Past Medical History  Diagnosis Date  . Obesity (BMI 35.0-39.9 without comorbidity)   . Anxiety     Past Surgical History  Procedure Laterality Date  . Cesarean section      Allergies  Allergen Reactions  . Amoxicillin Swelling  . Vancomycin     Red man syndrome    History  Substance Use Topics  . Smoking status: Never Smoker   . Smokeless tobacco: Not on file  . Alcohol Use: No   Family History: History reviewed. No pertinent family history.  Hospital Course:  The patient was admitted for acute pelvic pain. In the emergency department she underwent a CT scan which suggested a perforation of the uterus from a malpositioned IUD. Based on these findings and the patient's desire for permanent sterilization, she was taken to the operating room for the above procedures. A hysteroscopy and cystoscopy were performed to ensure that no  perforation occurred. Based on findings during the surgery, it does not appear that the IUD perforated the uterus. Postoperatively the plan was for her to be discharged. However, she required intravenous pain medication to control her pain and was therefore, watched overnight for pain control. On postoperative day 1 her pain was well controlled using oral pain medication. She was tolerating an oral diet. She was ambulating without difficulty. She was able to void. Her vitals were stable and she had been afebrile throughout the entire hospital course. She was therefore considered to be an appropriate candidate for discharge.  Discharge Exam: BP 112/75 mmHg  Pulse 71  Temp(Src) 97.9 F (36.6 C) (Oral)  Resp 20  Ht 5\' 5"  (1.651 m)  Wt 210 lb (95.255 kg)  BMI 34.95 kg/m2  SpO2 100%  LMP  (LMP Unknown) General  no apparent distress   CV  RRR   Pulmonary  clear to ausculatation bllaterally   Abdomen  Bowel sounds: present  Incision: clean, dry, intact and same staples have fallen out. 1/8" steri strips placed over these areas.   Extremities  no edema, symmetric, SCDs in place    Condition at Discharge: Stable  Complications affecting treatment: None  Discharge Medications:    Medication List    TAKE these medications        desvenlafaxine 50 MG 24 hr tablet  Commonly known as:  PRISTIQ  Take 50 mg by mouth daily.     ibuprofen 600 MG tablet  Commonly known as:  ADVIL,MOTRIN  Take 1 tablet (600 mg total) by mouth every 6 (six) hours as needed (mild pain).  oxyCODONE-acetaminophen 5-325 MG per tablet  Commonly known as:  PERCOCET/ROXICET  Take 2 tablets by mouth every 4 (four) hours as needed (moderate to severe pain (when tolerating fluids)).       Follow-up arrangements:  Follow-up Information    Follow up with Conard NovakJackson, Stephen D, MD. Schedule an appointment as soon as possible for a visit in 1 week.   Specialty:  Obstetrics and Gynecology   Why:  For wound re-check    Contact information:   [address] [place] [address] [phone]      Discharge Disposition: home  Signed: Conard NovakStephen D. Jackson, MD, [facility] 03/15/2015 10:56 AM

## 2016-01-25 LAB — HM PAP SMEAR: HM Pap smear: NEGATIVE

## 2017-01-28 ENCOUNTER — Other Ambulatory Visit: Payer: Self-pay | Admitting: Obstetrics and Gynecology

## 2017-01-28 DIAGNOSIS — I10 Essential (primary) hypertension: Secondary | ICD-10-CM

## 2017-02-25 ENCOUNTER — Other Ambulatory Visit: Payer: Self-pay | Admitting: Obstetrics and Gynecology

## 2017-02-25 DIAGNOSIS — I10 Essential (primary) hypertension: Secondary | ICD-10-CM

## 2017-08-05 ENCOUNTER — Other Ambulatory Visit: Payer: Self-pay | Admitting: Obstetrics and Gynecology

## 2017-08-05 DIAGNOSIS — I10 Essential (primary) hypertension: Secondary | ICD-10-CM

## 2017-08-05 NOTE — Telephone Encounter (Signed)
Please advise for refill. Thank you.  

## 2017-08-13 ENCOUNTER — Other Ambulatory Visit: Payer: Self-pay | Admitting: Obstetrics and Gynecology

## 2017-08-13 DIAGNOSIS — I10 Essential (primary) hypertension: Secondary | ICD-10-CM

## 2017-08-16 ENCOUNTER — Other Ambulatory Visit: Payer: Self-pay | Admitting: Obstetrics and Gynecology

## 2017-08-16 DIAGNOSIS — I10 Essential (primary) hypertension: Secondary | ICD-10-CM

## 2017-08-19 ENCOUNTER — Other Ambulatory Visit: Payer: Self-pay | Admitting: Obstetrics and Gynecology

## 2017-08-19 DIAGNOSIS — I10 Essential (primary) hypertension: Secondary | ICD-10-CM

## 2017-09-24 ENCOUNTER — Ambulatory Visit (INDEPENDENT_AMBULATORY_CARE_PROVIDER_SITE_OTHER): Payer: BC Managed Care – PPO | Admitting: Obstetrics and Gynecology

## 2017-09-24 ENCOUNTER — Encounter: Payer: Self-pay | Admitting: Obstetrics and Gynecology

## 2017-09-24 VITALS — BP 138/82 | Ht 65.0 in | Wt 231.0 lb

## 2017-09-24 DIAGNOSIS — I10 Essential (primary) hypertension: Secondary | ICD-10-CM | POA: Diagnosis not present

## 2017-09-24 DIAGNOSIS — N6320 Unspecified lump in the left breast, unspecified quadrant: Secondary | ICD-10-CM | POA: Diagnosis not present

## 2017-09-24 DIAGNOSIS — Z1239 Encounter for other screening for malignant neoplasm of breast: Secondary | ICD-10-CM

## 2017-09-24 DIAGNOSIS — Z0000 Encounter for general adult medical examination without abnormal findings: Secondary | ICD-10-CM | POA: Diagnosis not present

## 2017-09-24 DIAGNOSIS — Z13220 Encounter for screening for lipoid disorders: Secondary | ICD-10-CM | POA: Diagnosis not present

## 2017-09-24 DIAGNOSIS — Z01419 Encounter for gynecological examination (general) (routine) without abnormal findings: Secondary | ICD-10-CM | POA: Diagnosis not present

## 2017-09-24 DIAGNOSIS — Z1231 Encounter for screening mammogram for malignant neoplasm of breast: Secondary | ICD-10-CM | POA: Diagnosis not present

## 2017-09-24 HISTORY — DX: Essential (primary) hypertension: I10

## 2017-09-24 MED ORDER — LISINOPRIL 10 MG PO TABS: 10.0000 mg | ORAL_TABLET | Freq: Every day | ORAL | 1 refills | Status: DC

## 2017-09-24 NOTE — Progress Notes (Addendum)
PCP:  Patient, No Pcp Per   Chief Complaint  Patient presents with  . Gynecologic Exam     HPI:      [name] is a 41 y.o. [id number] who LMP was Patient's last menstrual period was 08/31/2017., presents today for her annual examination.  Her menses are regular every 28-30 days, lasting 5 days.  Dysmenorrhea mild, occurring first 1-2 days of flow. She does not have intermenstrual bleeding.  Sex activity: single partner, contraception - tubal ligation.  Last Pap: January 25, 2016  Results were: no abnormalities /neg HPV DNA  Hx of STDs: none  There is no FH of breast cancer. There is no FH of ovarian cancer. The patient does not do self-breast exams. She has sub-q scarring of LT breast at 10:00 position after horse bite. Pt had neg eval with bx in the past. No change in area per pt report.  Tobacco use: The patient denies current or previous tobacco use. Alcohol use: none No drug use.  Exercise: not active  She does get adequate calcium and Vitamin D in her diet.  She takes lisinopril 10 mg daily for HTN with sx control. BPs are 120s/80s at home. No side effects. Pt due for labs/Rx RF.  She had elevated lipids and Vit D deficiency on 3/17 labs. She is taking Vit D supp. Due for repeat lipids.  She has a hx of anxiety and takes effexor with sx control. Pt doing really well. Sees psych.    Past Medical History:  Diagnosis Date  . Anxiety   . Essential hypertension 09/24/2017  . Obesity (BMI 35.0-39.9 without comorbidity)     Past Surgical History:  Procedure Laterality Date  . CESAREAN SECTION    . CYSTOSCOPY N/A 03/14/2015   Procedure: CYSTOSCOPY;  Surgeon: Conard Novak, MD;  Location: [facility];  Service: Gynecology;  Laterality: N/A;  . HYSTEROSCOPY N/A 03/14/2015   Procedure: HYSTEROSCOPY;  Surgeon: Conard Novak, MD;  Location: [facility] OR[facility];  Service: Gynecology;  Laterality: N/A;  . IUD REMOVAL N/A 03/14/2015   Procedure: INTRAUTERINE DEVICE (IUD)  REMOVAL;  Surgeon: Conard Novak, MD;  Location: [facility] OR[facility];  Service: Gynecology;  Laterality: N/A;  . LAPAROSCOPY N/A 03/14/2015   Procedure: LAPAROSCOPY DIAGNOSTIC;  Surgeon: Conard Novak, MD;  Location: [facility] OR[facility];  Service: Gynecology;  Laterality: N/A;  . TUBAL LIGATION Bilateral 03/14/2015   Procedure: BILATERAL TUBAL LIGATION;  Surgeon: Conard Novak, MD;  Location: [facility] OR[facility];  Service: Gynecology;  Laterality: Bilateral;    Family History  Problem Relation Age of Onset  . Skin cancer Paternal Grandfather   . Skin cancer Paternal Uncle     Social History   Socioeconomic History  . Marital status: Married    Spouse name: Not on file  . Number of children: Not on file  . Years of education: Not on file  . Highest education level: Not on file  Social Needs  . Financial resource strain: Not on file  . Food insecurity - worry: Not on file  . Food insecurity - inability: Not on file  . Transportation needs - medical: Not on file  . Transportation needs - non-medical: Not on file  Occupational History  . Not on file  Tobacco Use  . Smoking status: Never Smoker  . Smokeless tobacco: Never Used  Substance and Sexual Activity  . Alcohol use: No  . Drug use: No  . Sexual activity: Yes    Birth control/protection: Surgical  Other Topics Concern  . Not on file  Social History Narrative  . Not on file    Current Meds  Medication Sig  . lisinopril (PRINIVIL,ZESTRIL) 10 MG tablet Take 1 tablet (10 mg total) by mouth daily.  . [DISCONTINUED] lisinopril (PRINIVIL,ZESTRIL) 10 MG tablet TAKE 1 TABLET BY MOUTH ONCE DAILY     ROS:  Review of Systems  Constitutional: Negative for fatigue, fever and unexpected weight change.  Respiratory: Negative for cough, shortness of breath and wheezing.   Cardiovascular: Negative for chest pain, palpitations and leg swelling.  Gastrointestinal: Positive for constipation. Negative for blood in stool, diarrhea, nausea and vomiting.    Endocrine: Negative for cold intolerance, heat intolerance and polyuria.  Genitourinary: Negative for dyspareunia, dysuria, flank pain, frequency, genital sores, hematuria, menstrual problem, pelvic pain, urgency, vaginal bleeding, vaginal discharge and vaginal pain.  Musculoskeletal: Negative for back pain, joint swelling and myalgias.  Skin: Negative for rash.  Neurological: Negative for dizziness, syncope, light-headedness, numbness and headaches.  Hematological: Negative for adenopathy.  Psychiatric/Behavioral: Negative for agitation, confusion, sleep disturbance and suicidal ideas. The patient is not nervous/anxious.      Objective: BP 138/82   Ht 5\' 5"  (1.651 m)   Wt 231 lb (104.8 kg)   LMP 08/31/2017   BMI 38.44 kg/m    Physical Exam  Constitutional: She is oriented to person, place, and time. She appears well-developed and well-nourished.  Genitourinary: Vagina normal and uterus normal. There is no rash or tenderness on the right labia. There is no rash or tenderness on the left labia. No erythema or tenderness in the vagina. No vaginal discharge found. Right adnexum does not display mass and does not display tenderness. Left adnexum does not display mass and does not display tenderness. Cervix does not exhibit motion tenderness or polyp. Uterus is not enlarged or tender.  Neck: Normal range of motion. No thyromegaly present.  Cardiovascular: Normal rate, regular rhythm and normal heart sounds.  No murmur heard. Pulmonary/Chest: Effort normal and breath sounds normal. Right breast exhibits no mass, no nipple discharge, no skin change and no tenderness. Left breast exhibits mass. Left breast exhibits no nipple discharge, no skin change and no tenderness.    Abdominal: Soft. There is no tenderness. There is no guarding.  Musculoskeletal: Normal range of motion.  Neurological: She is alert and oriented to person, place, and time. No cranial nerve deficit.  Psychiatric: She has a  normal mood and affect. Her behavior is normal.  Vitals reviewed.   Assessment/Plan: Encounter for annual routine gynecological examination  Screening for breast cancer - Pt to sched mammo. - Plan: MM DIGITAL SCREENING BILATERAL  Left breast mass - Scar from horse bite. Neg bx in the past. Stable. No further eval needed.  Blood tests for routine general physical examination - Plan: Comprehensive metabolic panel, Lipid panel  Screening cholesterol level - Plan: Lipid panel  Essential hypertension - Rx RF lisinopril. Check labs. RTO in 6 mo for BP check/f/u. - Plan: Comprehensive metabolic panel, lisinopril (PRINIVIL,ZESTRIL) 10 MG tablet  Meds ordered this encounter  Medications  . lisinopril (PRINIVIL,ZESTRIL) 10 MG tablet    Sig: Take 1 tablet (10 mg total) by mouth daily.    Dispense:  90 tablet    Refill:  1             GYN counsel mammography screening, adequate intake of calcium and vitamin D, diet and exercise     F/U  Return in about 6 months (around  03/24/2018) for BP check.  Tyrelle Raczka B. Savva Beamer, PA-C 09/25/2017 9:32 AM

## 2017-09-24 NOTE — Patient Instructions (Addendum)
I value your feedback and entrusting us with your care. If you get a [facility] patient survey, I would appreciate you taking the time to let us know about your experience today. Thank you!  [facility]: [phone]  [facility]: [phone]  

## 2017-09-25 LAB — COMPREHENSIVE METABOLIC PANEL
ALT: 16 IU/L (ref 0–32)
AST: 21 IU/L (ref 0–40)
Albumin/Globulin Ratio: 1.7 (ref 1.2–2.2)
Albumin: 4.5 g/dL (ref 3.5–5.5)
Alkaline Phosphatase: 72 IU/L (ref 39–117)
BUN/Creatinine Ratio: 12 (ref 9–23)
BUN: 9 mg/dL (ref 6–24)
Bilirubin Total: 0.3 mg/dL (ref 0.0–1.2)
CALCIUM: 9.1 mg/dL (ref 8.7–10.2)
CO2: 22 mmol/L (ref 20–29)
Chloride: 102 mmol/L (ref 96–106)
Creatinine, Ser: 0.75 mg/dL (ref 0.57–1.00)
GFR, EST AFRICAN AMERICAN: 115 mL/min/{1.73_m2} (ref >59)
GFR, EST NON AFRICAN AMERICAN: 100 mL/min/{1.73_m2} (ref >59)
GLUCOSE: 97 mg/dL (ref 65–99)
Globulin, Total: 2.7 g/dL (ref 1.5–4.5)
Potassium: 4.5 mmol/L (ref 3.5–5.2)
Sodium: 139 mmol/L (ref 134–144)
Total Protein: 7.2 g/dL (ref 6.0–8.5)

## 2017-09-25 LAB — LIPID PANEL
CHOL/HDL RATIO: 5.9 ratio — AB (ref 0.0–4.4)
Cholesterol, Total: 205 mg/dL — ABNORMAL HIGH (ref 100–199)
HDL: 35 mg/dL — ABNORMAL LOW (ref >39)
LDL Calculated: 116 mg/dL — ABNORMAL HIGH (ref 0–99)
Triglycerides: 270 mg/dL — ABNORMAL HIGH (ref 0–149)
VLDL CHOLESTEROL CAL: 54 mg/dL — AB (ref 5–40)

## 2018-04-09 ENCOUNTER — Other Ambulatory Visit: Payer: Self-pay | Admitting: Obstetrics and Gynecology

## 2018-04-09 DIAGNOSIS — I10 Essential (primary) hypertension: Secondary | ICD-10-CM

## 2018-04-09 NOTE — Telephone Encounter (Signed)
Please advise. Thank you

## 2018-04-15 ENCOUNTER — Encounter: Payer: Self-pay | Admitting: Obstetrics and Gynecology

## 2018-04-15 ENCOUNTER — Ambulatory Visit (INDEPENDENT_AMBULATORY_CARE_PROVIDER_SITE_OTHER): Payer: BC Managed Care – PPO | Admitting: Obstetrics and Gynecology

## 2018-04-15 VITALS — BP 128/80 | Ht 65.0 in | Wt 233.0 lb

## 2018-04-15 DIAGNOSIS — Z713 Dietary counseling and surveillance: Secondary | ICD-10-CM | POA: Diagnosis not present

## 2018-04-15 DIAGNOSIS — I10 Essential (primary) hypertension: Secondary | ICD-10-CM

## 2018-04-15 MED ORDER — LISINOPRIL 10 MG PO TABS: 10.0000 mg | ORAL_TABLET | Freq: Every day | ORAL | 1 refills | Status: DC

## 2018-04-15 NOTE — Patient Instructions (Signed)
I value your feedback and entrusting us with your care. If you get a [facility] patient survey, I would appreciate you taking the time to let us know about your experience today. Thank you! 

## 2018-04-15 NOTE — Progress Notes (Signed)
Patient, No Pcp Per   Chief Complaint  Patient presents with  . Follow-up    HPI:      [name] is a 42 y.o. [id number] who LMP was Patient's last menstrual period was 03/31/2018., presents today for BP f/u from 11/18. Pt on lisinopril with good BP control. No side effects/ACEI cough. CMP WNL 11/18. Had elevated lipids 11/18 and pt trying diet/exercise changes for lipids. Under increased stress with fam stressors and is a "stress eater". Due for lab rechk 11/19.   Past Medical History:  Diagnosis Date  . Anxiety   . Essential hypertension 09/24/2017  . Obesity (BMI 35.0-39.9 without comorbidity)     Past Surgical History:  Procedure Laterality Date  . CESAREAN SECTION    . CYSTOSCOPY N/A 03/14/2015   Procedure: CYSTOSCOPY;  Surgeon: Conard NovakStephen D Jackson, MD;  Location: [facility];  Service: Gynecology;  Laterality: N/A;  . HYSTEROSCOPY N/A 03/14/2015   Procedure: HYSTEROSCOPY;  Surgeon: Conard NovakStephen D Jackson, MD;  Location: [facility];  Service: Gynecology;  Laterality: N/A;  . IUD REMOVAL N/A 03/14/2015   Procedure: INTRAUTERINE DEVICE (IUD) REMOVAL;  Surgeon: Conard NovakStephen D Jackson, MD;  Location: [facility];  Service: Gynecology;  Laterality: N/A;  . LAPAROSCOPY N/A 03/14/2015   Procedure: LAPAROSCOPY DIAGNOSTIC;  Surgeon: Conard NovakStephen D Jackson, MD;  Location: [facility];  Service: Gynecology;  Laterality: N/A;  . TUBAL LIGATION Bilateral 03/14/2015   Procedure: BILATERAL TUBAL LIGATION;  Surgeon: Conard NovakStephen D Jackson, MD;  Location: [facility];  Service: Gynecology;  Laterality: Bilateral;    Family History  Problem Relation Age of Onset  . Skin cancer Paternal Grandfather   . Skin cancer Paternal Uncle     Social History   Socioeconomic History  . Marital status: Married    Spouse name: Not on file  . Number of children: Not on file  . Years of education: Not on file  . Highest education level: Not on file  Occupational History  . Not on file  Social Needs  . Financial resource  strain: Not on file  . Food insecurity:    Worry: Not on file    Inability: Not on file  . Transportation needs:    Medical: Not on file    Non-medical: Not on file  Tobacco Use  . Smoking status: Never Smoker  . Smokeless tobacco: Never Used  Substance and Sexual Activity  . Alcohol use: No  . Drug use: No  . Sexual activity: Yes    Birth control/protection: Surgical  Lifestyle  . Physical activity:    Days per week: Not on file    Minutes per session: Not on file  . Stress: Not on file  Relationships  . Social connections:    Talks on phone: Not on file    Gets together: Not on file    Attends religious service: Not on file    Active member of club or organization: Not on file    Attends meetings of clubs or organizations: Not on file    Relationship status: Not on file  . Intimate partner violence:    Fear of current or ex partner: Not on file    Emotionally abused: Not on file    Physically abused: Not on file    Forced sexual activity: Not on file  Other Topics Concern  . Not on file  Social History Narrative  . Not on file    Outpatient Medications Prior to Visit  Medication Sig Dispense  Refill  . traZODone (DESYREL) 50 MG tablet TK 1 T PO QHS PRF SLEEP  1  . venlafaxine XR (EFFEXOR-XR) 150 MG 24 hr capsule venlafaxine ER 150 mg capsule,extended release 24 hr    . lisinopril (PRINIVIL,ZESTRIL) 10 MG tablet Take 1 tablet (10 mg total) by mouth daily. 90 tablet 1  . ALPRAZolam (XANAX XR) 0.5 MG 24 hr tablet     . ibuprofen (ADVIL,MOTRIN) 600 MG tablet Take 1 tablet (600 mg total) by mouth every 6 (six) hours as needed (mild pain). (Patient not taking: Reported on 04/15/2018) 30 tablet 0   No facility-administered medications prior to visit.       ROS:  Review of Systems  Constitutional: Negative for fever.  Gastrointestinal: Negative for blood in stool, constipation, diarrhea, nausea and vomiting.  Genitourinary: Negative for dyspareunia, dysuria, flank  pain, frequency, hematuria, urgency, vaginal bleeding, vaginal discharge and vaginal pain.  Musculoskeletal: Negative for back pain.  Skin: Negative for rash.   BREAST: No symptoms   OBJECTIVE:   Vitals:  BP 128/80   Ht 5\' 5"  (1.651 m)   Wt 233 lb (105.7 kg)   LMP 03/31/2018   BMI 38.77 kg/m   Physical Exam  Constitutional: She is oriented to person, place, and time. She appears well-developed.  Neck: Normal range of motion.  Pulmonary/Chest: Effort normal.  Musculoskeletal: Normal range of motion.  Neurological: She is alert and oriented to person, place, and time. No cranial nerve deficit.  Psychiatric: She has a normal mood and affect. Her behavior is normal. Judgment and thought content normal.  Vitals reviewed.   Assessment/Plan: Essential hypertension - Doing well on lisinopril. Rx RF. Check CMP at 11/19 annual.  - Plan: lisinopril (PRINIVIL,ZESTRIL) 10 MG tablet  Weight loss counseling, encounter for - Diet/exercise/wt loss changes discussed. MyFitness Pal app/health food options/ meal prep.    Meds ordered this encounter  Medications  . lisinopril (PRINIVIL,ZESTRIL) 10 MG tablet    Sig: Take 1 tablet (10 mg total) by mouth daily.    Dispense:  90 tablet    Refill:  1    Order Specific Question:   Supervising Provider    Answer:   Nadara Mustard [[id number]]      Return if symptoms worsen or fail to improve.  [name], PA-C 04/15/2018 5:03 PM

## 2018-06-24 ENCOUNTER — Telehealth: Payer: Self-pay

## 2018-06-24 ENCOUNTER — Other Ambulatory Visit: Payer: Self-pay | Admitting: Obstetrics and Gynecology

## 2018-06-24 DIAGNOSIS — I10 Essential (primary) hypertension: Secondary | ICD-10-CM

## 2018-06-24 MED ORDER — LISINOPRIL 10 MG PO TABS: 10.0000 mg | ORAL_TABLET | Freq: Every day | ORAL | 0 refills | Status: DC

## 2018-06-24 NOTE — Telephone Encounter (Signed)
Rx eRxd to [facility].

## 2018-06-24 NOTE — Progress Notes (Signed)
Rx RF to new mail order, [facility].

## 2018-06-24 NOTE — Telephone Encounter (Signed)
Sarah w/[facility] [phone]#[phone] option #3. Requesting a refill of pt lisinopril. Please call fax or erx. Fax #[phone]. Erx #[phone]. Address [address] [place], [place] [id number]

## 2018-07-28 ENCOUNTER — Other Ambulatory Visit: Payer: Self-pay | Admitting: Obstetrics and Gynecology

## 2018-07-28 DIAGNOSIS — Z1231 Encounter for screening mammogram for malignant neoplasm of breast: Secondary | ICD-10-CM

## 2018-09-01 ENCOUNTER — Other Ambulatory Visit: Payer: Self-pay | Admitting: Obstetrics and Gynecology

## 2018-09-01 DIAGNOSIS — I10 Essential (primary) hypertension: Secondary | ICD-10-CM

## 2018-09-01 NOTE — Telephone Encounter (Signed)
Please advise 

## 2018-09-29 ENCOUNTER — Ambulatory Visit: Payer: BC Managed Care – PPO | Admitting: Obstetrics and Gynecology

## 2018-11-14 ENCOUNTER — Telehealth: Payer: Self-pay | Admitting: Obstetrics and Gynecology

## 2018-11-14 NOTE — Telephone Encounter (Signed)
Left detailed msg order is goog until Nov.  To call and reschedule.

## 2018-11-14 NOTE — Telephone Encounter (Signed)
Please make sure the patient's order for her mammogram is still "good".  She had to cancel her appointment and wants to reschedule.

## 2018-11-17 ENCOUNTER — Ambulatory Visit: Payer: BC Managed Care – PPO | Admitting: Obstetrics and Gynecology

## 2018-12-01 ENCOUNTER — Other Ambulatory Visit: Payer: Self-pay | Admitting: Obstetrics and Gynecology

## 2018-12-01 DIAGNOSIS — I10 Essential (primary) hypertension: Secondary | ICD-10-CM

## 2018-12-15 ENCOUNTER — Ambulatory Visit
Admission: RE | Admit: 2018-12-15 | Discharge: 2018-12-15 | Disposition: A | Discharge status: Home / Self Care | Payer: BC Managed Care – PPO | Source: Ambulatory Visit | Attending: Obstetrics and Gynecology | Admitting: Obstetrics and Gynecology

## 2018-12-15 DIAGNOSIS — Z1231 Encounter for screening mammogram for malignant neoplasm of breast: Secondary | ICD-10-CM

## 2018-12-15 NOTE — Progress Notes (Signed)
PCP:  Nadara Mustard, MD   Chief Complaint  Patient presents with  . Gynecologic Exam    pt has been feeling tired a lot, loss of appetite x 2 months     HPI:      [name] is a 43 y.o. [id number] who LMP was Patient's last menstrual period was 12/06/2018 (exact date)., presents today for her annual examination.  Her menses are regular every 28-30 days, lasting 7 days, increased from 5. Heavier flow this yr.  Dysmenorrhea mod, occurring first 1-2 days of flow. She now has  intermenstrual bleeding most months. Does have vasomotor sx and PMDD sx. Had IUD in past.   Sex activity: not sex active; s/p tubal ligation.  Last Pap: January 25, 2016  Results were: no abnormalities /neg HPV DNA  Hx of STDs: none  Last mammo: 12/15/18 Results: report not done yet. There is no FH of breast cancer. There is no FH of ovarian cancer. The patient does not do self-breast exams. She has sub-q scarring of LT breast at 10:00 position after horse bite. Pt had neg eval with bx in the past. No change in area per pt report.   Tobacco use: The patient denies current or previous tobacco use. Alcohol use: none No drug use.  Exercise: not active  She does get adequate calcium and Vitamin D in her diet.  She takes lisinopril 10 mg daily for HTN with sx control. BPs are 120s/80s at home, but elevated today. No side effects. Pt due for labs/Rx RF.  She had elevated lipids and Vit D deficiency on 3/17 labs. She is taking Vit D supp. Due for repeat lipids.  She has a hx of anxiety and takes effexor and buspar, dose increased 11/19. Sees psych. Pt notes increased fatigue, decreased motivation, decreased appetite without wt change for about 2 months. Not sure if related to meds or other. Pt sleeping about 8 hrs nightly but could sleep more. She has also been under increased stress. She notes increased irritability before her menses and only feels good about 1 wk a month.   She also has constipation with  bloating and hemorrhoid flare. Usually doesn't have bleeding. Has tried to drink more water and increase fiber in foods.    Past Medical History:  Diagnosis Date  . Anxiety   . Essential hypertension 09/24/2017  . External hemorrhoid   . Obesity (BMI 35.0-39.9 without comorbidity)     Past Surgical History:  Procedure Laterality Date  . BREAST BIOPSY Left    x2- neg / clips  . CESAREAN SECTION    . CYSTOSCOPY N/A 03/14/2015   Procedure: CYSTOSCOPY;  Surgeon: Conard Novak, MD;  Location: [facility];  Service: Gynecology;  Laterality: N/A;  . HYSTEROSCOPY N/A 03/14/2015   Procedure: HYSTEROSCOPY;  Surgeon: Conard Novak, MD;  Location: [facility];  Service: Gynecology;  Laterality: N/A;  . IUD REMOVAL N/A 03/14/2015   Procedure: INTRAUTERINE DEVICE (IUD) REMOVAL;  Surgeon: Conard Novak, MD;  Location: [facility];  Service: Gynecology;  Laterality: N/A;  . LAPAROSCOPY N/A 03/14/2015   Procedure: LAPAROSCOPY DIAGNOSTIC;  Surgeon: Conard Novak, MD;  Location: [facility];  Service: Gynecology;  Laterality: N/A;  . MM BREAST STEREO BIOPSY LEFT ([facility] HX)     [id number]  . TUBAL LIGATION Bilateral 03/14/2015   Procedure: BILATERAL TUBAL LIGATION;  Surgeon: Conard Novak, MD;  Location: [facility];  Service: Gynecology;  Laterality: Bilateral;    Family History  Problem Relation Age of Onset  . Skin cancer Paternal Grandfather 2670  . Skin cancer Paternal Uncle 40  . Breast cancer Neg Hx     Social History   Socioeconomic History  . Marital status: Divorced    Spouse name: Not on file  . Number of children: Not on file  . Years of education: Not on file  . Highest education level: Not on file  Occupational History  . Not on file  Social Needs  . Financial resource strain: Not on file  . Food insecurity:    Worry: Not on file    Inability: Not on file  . Transportation needs:    Medical: Not on file    Non-medical: Not on file  Tobacco Use  . Smoking status: Never  Smoker  . Smokeless tobacco: Never Used  Substance and Sexual Activity  . Alcohol use: No  . Drug use: No  . Sexual activity: Not Currently    Birth control/protection: Surgical  Lifestyle  . Physical activity:    Days per week: Not on file    Minutes per session: Not on file  . Stress: Not on file  Relationships  . Social connections:    Talks on phone: Not on file    Gets together: Not on file    Attends religious service: Not on file    Active member of club or organization: Not on file    Attends meetings of clubs or organizations: Not on file    Relationship status: Not on file  . Intimate partner violence:    Fear of current or ex partner: Not on file    Emotionally abused: Not on file    Physically abused: Not on file    Forced sexual activity: Not on file  Other Topics Concern  . Not on file  Social History Narrative  . Not on file    Current Meds  Medication Sig  . ALPRAZolam (XANAX XR) 0.5 MG 24 hr tablet   . busPIRone (BUSPAR) 10 MG tablet   . lisinopril (PRINIVIL,ZESTRIL) 10 MG tablet Take 1 tablet (10 mg total) by mouth daily.  . traZODone (DESYREL) 50 MG tablet TK 1 T PO QHS PRF SLEEP  . Venlafaxine HCl 225 MG TB24   . [DISCONTINUED] lisinopril (PRINIVIL,ZESTRIL) 10 MG tablet Take 1 tablet (10 mg total) by mouth daily.     ROS:  Review of Systems  Constitutional: Positive for appetite change and fatigue. Negative for fever and unexpected weight change.  Respiratory: Negative for cough, shortness of breath and wheezing.   Cardiovascular: Negative for chest pain, palpitations and leg swelling.  Gastrointestinal: Positive for constipation and nausea. Negative for blood in stool, diarrhea and vomiting.  Endocrine: Negative for cold intolerance, heat intolerance and polyuria.  Genitourinary: Negative for dyspareunia, dysuria, flank pain, frequency, genital sores, hematuria, menstrual problem, pelvic pain, urgency, vaginal bleeding, vaginal discharge and  vaginal pain.  Musculoskeletal: Negative for back pain, joint swelling and myalgias.  Skin: Negative for rash.  Neurological: Negative for dizziness, syncope, light-headedness, numbness and headaches.  Hematological: Negative for adenopathy.  Psychiatric/Behavioral: Positive for agitation. Negative for confusion, sleep disturbance and suicidal ideas. The patient is not nervous/anxious.   BREAST: mass/tenderness LT breast horse bite scar  Objective: BP 130/90   Pulse 78   Ht 5\' 5"  (1.651 m)   Wt 235 lb (106.6 kg)   LMP 12/06/2018 (Exact Date)   BMI 39.11 kg/m    Physical Exam Constitutional:  Appearance: She is well-developed.  Genitourinary:     Vulva, vagina, uterus, right adnexa and left adnexa normal.     No vulval lesion or tenderness noted.     No vaginal discharge, erythema or tenderness.     No cervical motion tenderness or polyp.     Uterus is not enlarged or tender.     No right or left adnexal mass present.     Right adnexa not tender.     Left adnexa not tender.  Neck:     Musculoskeletal: Normal range of motion.     Thyroid: No thyromegaly.  Cardiovascular:     Rate and Rhythm: Normal rate and regular rhythm.     Heart sounds: Normal heart sounds. No murmur.  Pulmonary:     Effort: Pulmonary effort is normal.     Breath sounds: Normal breath sounds.  Chest:     Breasts:        Right: No mass, nipple discharge, skin change or tenderness.        Left: Mass present. No nipple discharge, skin change or tenderness.    Abdominal:     Palpations: Abdomen is soft.     Tenderness: There is no abdominal tenderness. There is no guarding.  Musculoskeletal: Normal range of motion.  Neurological:     Mental Status: She is alert and oriented to person, place, and time.     Cranial Nerves: No cranial nerve deficit.  Psychiatric:        Behavior: Behavior normal.  Vitals signs reviewed.     Assessment/Plan: Encounter for annual routine gynecological  examination  Cervical cancer screening - Plan: Cytology - PAP  Screening for HPV (human papillomavirus) - Plan: Cytology - PAP  Screening for breast cancer - Pt had mammo yesterday.  Awaiting results.  DUB (dysfunctional uterine bleeding) - Check labs. If neg, will check u/s.  - Plan: TSH + free T4, Prolactin  Essential hypertension - BP improved with rechk. Rx RF lisinopril. Check CMP. Pt to cont to check BPs at home and f/u if >140/90. - Plan: Comprehensive metabolic panel, lisinopril (PRINIVIL,ZESTRIL) 10 MG tablet  External hemorrhoid - Rx analpram crm. Increased fiber/add colace. F/u prn.  - Plan: hydrocortisone-pramoxine (ANALPRAM-HC) 2.5-1 % rectal cream  Blood tests for routine general physical examination - Plan: Comprehensive metabolic panel, Lipid panel, CBC with Differential/Platelet, Hemoglobin A1c, TSH + free T4, Prolactin  Screening cholesterol level - Plan: Lipid panel  Fatigue, unspecified type - Check labs. Will f/u with results. If neg, pt to f/u with psych re: poss med side effects. - Plan: CBC with Differential/Platelet, Hemoglobin A1c, TSH + free T4  Diabetes mellitus screening - Plan: Hemoglobin A1c  Meds ordered this encounter  Medications  . hydrocortisone-pramoxine (ANALPRAM-HC) 2.5-1 % rectal cream    Sig: Place 1 application rectally 3 (three) times daily.    Dispense:  30 g    Refill:  0    Order Specific Question:   Supervising Provider    Answer:   Nadara Mustard [id number]  . lisinopril (PRINIVIL,ZESTRIL) 10 MG tablet    Sig: Take 1 tablet (10 mg total) by mouth daily.    Dispense:  90 tablet    Refill:  3    Order Specific Question:   Supervising Provider    Answer:   Nadara Mustard [[id number]]             GYN counsel mammography screening, adequate intake of calcium and vitamin D, diet and  exercise     F/U  Return in about 1 year (around 12/17/2019).  [name], PA-C 12/16/2018 9:27 AM

## 2018-12-16 ENCOUNTER — Ambulatory Visit (INDEPENDENT_AMBULATORY_CARE_PROVIDER_SITE_OTHER): Payer: BC Managed Care – PPO | Admitting: Obstetrics and Gynecology

## 2018-12-16 ENCOUNTER — Encounter: Payer: Self-pay | Admitting: Obstetrics and Gynecology

## 2018-12-16 ENCOUNTER — Other Ambulatory Visit (HOSPITAL_COMMUNITY)
Admission: RE | Admit: 2018-12-16 | Discharge: 2018-12-16 | Disposition: A | Discharge status: Home / Self Care | Payer: BC Managed Care – PPO | Source: Ambulatory Visit | Attending: Obstetrics and Gynecology | Admitting: Obstetrics and Gynecology

## 2018-12-16 VITALS — BP 130/90 | HR 78 | Ht 65.0 in | Wt 235.0 lb

## 2018-12-16 DIAGNOSIS — Z1151 Encounter for screening for human papillomavirus (HPV): Secondary | ICD-10-CM

## 2018-12-16 DIAGNOSIS — Z131 Encounter for screening for diabetes mellitus: Secondary | ICD-10-CM

## 2018-12-16 DIAGNOSIS — Z0000 Encounter for general adult medical examination without abnormal findings: Secondary | ICD-10-CM

## 2018-12-16 DIAGNOSIS — Z124 Encounter for screening for malignant neoplasm of cervix: Secondary | ICD-10-CM

## 2018-12-16 DIAGNOSIS — I10 Essential (primary) hypertension: Secondary | ICD-10-CM

## 2018-12-16 DIAGNOSIS — Z13220 Encounter for screening for lipoid disorders: Secondary | ICD-10-CM

## 2018-12-16 DIAGNOSIS — Z1239 Encounter for other screening for malignant neoplasm of breast: Secondary | ICD-10-CM

## 2018-12-16 DIAGNOSIS — N938 Other specified abnormal uterine and vaginal bleeding: Secondary | ICD-10-CM

## 2018-12-16 DIAGNOSIS — Z01419 Encounter for gynecological examination (general) (routine) without abnormal findings: Secondary | ICD-10-CM

## 2018-12-16 DIAGNOSIS — R5383 Other fatigue: Secondary | ICD-10-CM

## 2018-12-16 DIAGNOSIS — K644 Residual hemorrhoidal skin tags: Secondary | ICD-10-CM

## 2018-12-16 MED ORDER — HYDROCORTISONE ACE-PRAMOXINE 2.5-1 % RE CREA: 1.0000 "application " | TOPICAL_CREAM | Freq: Three times a day (TID) | RECTAL | 0 refills | Status: DC

## 2018-12-16 MED ORDER — LISINOPRIL 10 MG PO TABS: 10.0000 mg | ORAL_TABLET | Freq: Every day | ORAL | 3 refills | Status: DC

## 2018-12-17 ENCOUNTER — Encounter: Payer: Self-pay | Admitting: Obstetrics and Gynecology

## 2018-12-17 LAB — CBC WITH DIFFERENTIAL/PLATELET
BASOS ABS: 0.1 10*3/uL (ref 0.0–0.2)
BASOS: 1 %
EOS (ABSOLUTE): 0.2 10*3/uL (ref 0.0–0.4)
Eos: 2 %
Hematocrit: 38.9 % (ref 34.0–46.6)
Hemoglobin: 12.4 g/dL (ref 11.1–15.9)
IMMATURE GRANS (ABS): 0 10*3/uL (ref 0.0–0.1)
Immature Granulocytes: 0 %
Lymphocytes Absolute: 2.6 10*3/uL (ref 0.7–3.1)
Lymphs: 31 %
MCH: 28.2 pg (ref 26.6–33.0)
MCHC: 31.9 g/dL (ref 31.5–35.7)
MCV: 89 fL (ref 79–97)
MONOCYTES: 5 %
Monocytes Absolute: 0.4 10*3/uL (ref 0.1–0.9)
NEUTROS ABS: 5 10*3/uL (ref 1.4–7.0)
Neutrophils: 61 %
PLATELETS: 313 10*3/uL (ref 150–450)
RBC: 4.39 x10E6/uL (ref 3.77–5.28)
RDW: 14.6 % (ref 11.7–15.4)
WBC: 8.3 10*3/uL (ref 3.4–10.8)

## 2018-12-17 LAB — TSH+FREE T4
FREE T4: 0.87 ng/dL (ref 0.82–1.77)
TSH: 1.45 u[IU]/mL (ref 0.450–4.500)

## 2018-12-17 LAB — COMPREHENSIVE METABOLIC PANEL
ALK PHOS: 69 IU/L (ref 39–117)
ALT: 18 IU/L (ref 0–32)
AST: 20 IU/L (ref 0–40)
Albumin/Globulin Ratio: 1.9 (ref 1.2–2.2)
Albumin: 4.7 g/dL (ref 3.8–4.8)
BUN/Creatinine Ratio: 15 (ref 9–23)
BUN: 12 mg/dL (ref 6–24)
Bilirubin Total: 0.3 mg/dL (ref 0.0–1.2)
CO2: 22 mmol/L (ref 20–29)
Calcium: 9.9 mg/dL (ref 8.7–10.2)
Chloride: 100 mmol/L (ref 96–106)
Creatinine, Ser: 0.8 mg/dL (ref 0.57–1.00)
GFR calc Af Amer: 105 mL/min/{1.73_m2} (ref >59)
GFR calc non Af Amer: 91 mL/min/{1.73_m2} (ref >59)
GLOBULIN, TOTAL: 2.5 g/dL (ref 1.5–4.5)
Glucose: 88 mg/dL (ref 65–99)
Potassium: 4.3 mmol/L (ref 3.5–5.2)
SODIUM: 139 mmol/L (ref 134–144)
Total Protein: 7.2 g/dL (ref 6.0–8.5)

## 2018-12-17 LAB — LIPID PANEL
CHOL/HDL RATIO: 5.8 ratio — AB (ref 0.0–4.4)
CHOLESTEROL TOTAL: 219 mg/dL — AB (ref 100–199)
HDL: 38 mg/dL — ABNORMAL LOW (ref >39)
LDL Calculated: 122 mg/dL — ABNORMAL HIGH (ref 0–99)
TRIGLYCERIDES: 296 mg/dL — AB (ref 0–149)
VLDL Cholesterol Cal: 59 mg/dL — ABNORMAL HIGH (ref 5–40)

## 2018-12-17 LAB — HEMOGLOBIN A1C
Est. average glucose Bld gHb Est-mCnc: 111 mg/dL
HEMOGLOBIN A1C: 5.5 % (ref 4.8–5.6)

## 2018-12-17 LAB — PROLACTIN: Prolactin: 11.5 ng/mL (ref 4.8–23.3)

## 2018-12-17 LAB — CYTOLOGY - PAP
DIAGNOSIS: UNDETERMINED — AB
HPV (WINDOPATH): NOT DETECTED

## 2018-12-18 ENCOUNTER — Telehealth: Payer: Self-pay | Admitting: Obstetrics and Gynecology

## 2018-12-18 DIAGNOSIS — E782 Mixed hyperlipidemia: Secondary | ICD-10-CM

## 2018-12-18 DIAGNOSIS — I10 Essential (primary) hypertension: Secondary | ICD-10-CM

## 2018-12-18 DIAGNOSIS — N938 Other specified abnormal uterine and vaginal bleeding: Secondary | ICD-10-CM

## 2018-12-18 NOTE — Telephone Encounter (Signed)
Pt aware of normal labs except lipids. Pt would like to try diet/exercise/wt loss changes first. Add fish oil 2 g daily for TGs. Rechk in 6 months.  Pt to f/u with psych re: fatigue/motivation due to neg labs for sx and possible Rx etiology of sx.  Pt had neg DUB labs. Check u/s for DUB--will call with results.

## 2018-12-18 NOTE — Addendum Note (Signed)
Addended by: Althea Grimmer B on: 12/18/2018 11:51 AM   Modules accepted: Orders

## 2018-12-23 ENCOUNTER — Ambulatory Visit: Payer: BC Managed Care – PPO

## 2019-01-07 ENCOUNTER — Ambulatory Visit
Admission: RE | Admit: 2019-01-07 | Discharge: 2019-01-07 | Disposition: A | Discharge status: Home / Self Care | Payer: BC Managed Care – PPO | Source: Ambulatory Visit | Attending: Obstetrics and Gynecology | Admitting: Obstetrics and Gynecology

## 2019-01-07 ENCOUNTER — Other Ambulatory Visit: Payer: Self-pay

## 2019-01-07 DIAGNOSIS — N938 Other specified abnormal uterine and vaginal bleeding: Secondary | ICD-10-CM | POA: Insufficient documentation

## 2019-01-07 NOTE — Progress Notes (Signed)
Pt with DUB and menorrhagia. Should she be scheduled for SHGM vs EMB alone? Or D&C for sx mgmt and dx? Thx

## 2019-01-13 ENCOUNTER — Other Ambulatory Visit: Payer: Self-pay

## 2019-01-13 ENCOUNTER — Telehealth: Payer: Self-pay

## 2019-01-13 ENCOUNTER — Encounter: Payer: Self-pay | Admitting: Obstetrics & Gynecology

## 2019-01-13 ENCOUNTER — Ambulatory Visit (INDEPENDENT_AMBULATORY_CARE_PROVIDER_SITE_OTHER): Payer: BC Managed Care – PPO | Admitting: Obstetrics & Gynecology

## 2019-01-13 VITALS — BP 140/80 | Ht 65.0 in | Wt 236.0 lb

## 2019-01-13 DIAGNOSIS — N921 Excessive and frequent menstruation with irregular cycle: Secondary | ICD-10-CM | POA: Diagnosis not present

## 2019-01-13 DIAGNOSIS — R9389 Abnormal findings on diagnostic imaging of other specified body structures: Secondary | ICD-10-CM | POA: Diagnosis not present

## 2019-01-13 MED ORDER — MISOPROSTOL 200 MCG PO TABS: 200.0000 ug | ORAL_TABLET | Freq: Once | ORAL | 0 refills | Status: DC

## 2019-01-13 MED ORDER — HYDROCODONE-ACETAMINOPHEN 5-325 MG PO TABS: 1.0000 | ORAL_TABLET | Freq: Four times a day (QID) | ORAL | 0 refills | Status: DC | PRN

## 2019-01-13 MED ORDER — PROMETHAZINE HCL 25 MG PO TABS: 25.0000 mg | ORAL_TABLET | Freq: Once | ORAL | 0 refills | Status: DC

## 2019-01-13 MED ORDER — DIAZEPAM 5 MG PO TABS: 5.0000 mg | ORAL_TABLET | Freq: Once | ORAL | 0 refills | Status: AC

## 2019-01-13 NOTE — Telephone Encounter (Signed)
Pt calling; has appt this pm; can they just talk over the phone or does he need to do an exam?  [phone] Adv pt that a face to face conversation would be better.  Pt understands and will be here.

## 2019-01-13 NOTE — Progress Notes (Signed)
HPI:      [name] is a 43 y.o. [id number] who LMP was Patient's last menstrual period was 01/03/2019., presents today for a problem visit.  She complains of metrorrhagia (midcycle).   Her menses are regular every 28-30 days, lasting 7 days, increased from 5. Heavier flow this yr.  Dysmenorrhea mod, occurring first 1-2 days of flow. She now has  intermenstrual bleeding most months for the past year.  Recent ultrasound revealed thickened endometrium and no fibroids.    PMHx: She  has a past medical history of Anxiety, Essential hypertension (09/24/2017), External hemorrhoid, and Obesity (BMI 35.0-39.9 without comorbidity). Also,  has a past surgical history that includes Cesarean section; laparoscopy (N/A, 03/14/2015); Tubal ligation (Bilateral, 03/14/2015); Hysteroscopy (N/A, 03/14/2015); Cystoscopy (N/A, 03/14/2015); IUD removal (N/A, 03/14/2015); MM BREAST STEREO BIOPSY LEFT ([facility] HX); and Breast biopsy (Left)., family history includes Skin cancer (age of onset: 13) in her paternal uncle; Skin cancer (age of onset: 106) in her paternal grandfather.,  reports that she has never smoked. She has never used smokeless tobacco. She reports that she does not drink alcohol or use drugs.  She  Current Outpatient Medications:  .  ALPRAZolam (XANAX XR) 0.5 MG 24 hr tablet, , Disp: , Rfl:  .  Brexpiprazole (REXULTI) 0.5 MG TABS, Take by mouth., Disp: , Rfl:  .  busPIRone (BUSPAR) 10 MG tablet, , Disp: , Rfl:  .  hydrocortisone-pramoxine (ANALPRAM-HC) 2.5-1 % rectal cream, Place 1 application rectally 3 (three) times daily., Disp: 30 g, Rfl: 0 .  lisinopril (PRINIVIL,ZESTRIL) 10 MG tablet, Take 1 tablet (10 mg total) by mouth daily., Disp: 90 tablet, Rfl: 3 .  traZODone (DESYREL) 50 MG tablet, TK 1 T PO QHS PRF SLEEP, Disp: , Rfl: 1 .  Venlafaxine HCl 225 MG TB24, , Disp: , Rfl:  .  diazepam (VALIUM) 5 MG tablet, Take 1 tablet (5 mg total) by mouth once for 1 dose. One hour prior to procedure, Disp: 2  tablet, Rfl: 0 .  HYDROcodone-acetaminophen (NORCO) 5-325 MG tablet, Take 1 tablet by mouth every 6 (six) hours as needed for moderate pain. Take one hour prior to procedure, Disp: 10 tablet, Rfl: 0 .  misoprostol (CYTOTEC) 200 MCG tablet, Take 1 tablet (200 mcg total) by mouth once for 1 dose. Night before procedure, Disp: 1 tablet, Rfl: 0 .  promethazine (PHENERGAN) 25 MG tablet, Take 1 tablet (25 mg total) by mouth once for 1 dose. One hour before procedure, Disp: 5 tablet, Rfl: 0 .  venlafaxine XR (EFFEXOR-XR) 150 MG 24 hr capsule, TK ONE C PO QD, Disp: , Rfl:   Also, is allergic to amoxicillin and vancomycin.  Review of Systems  All other systems reviewed and are negative.   Objective: BP 140/80   Ht 5\' 5"  (1.651 m)   Wt 236 lb (107 kg)   LMP 01/03/2019   BMI 39.27 kg/m  Physical Exam Constitutional:      General: She is not in acute distress.    Appearance: She is well-developed.  Musculoskeletal: Normal range of motion.  Neurological:     Mental Status: She is alert and oriented to person, place, and time.  Skin:    General: Skin is warm and dry.  Vitals signs reviewed.     ASSESSMENT/PLAN:   Problem List Items Addressed This Visit      Other   Metrorrhagia - Primary   Endometrial thickening on ultrasound    Discussed options for evaluation.  SHG  vs office hysteroscopy (endosee).  Needs EMB as well.  Will schedule. Surgical options of hysteroscopy, polypectomy, D&C as needed.   Annamarie Major, MD, [facility], [facility] 01/13/2019  5:13 PM

## 2019-01-13 NOTE — Patient Instructions (Signed)
Instructions for in office Hysteroscopy  1. Pre-procedure and Post-procedure medications:  Pick up Cytotec, Valium, Phenergan, and Norco from your pharmacy  Take medications as directed- Cytotec the night before and the others an hour before  2. Begin taking Motrin 800mg  48 hours to prior procedure.  Take 1 tablet every 8 hours. Please notify doctor and do not take if you have a history of stomach ulcers or have been notified by a physician that you cannot take Motrin or Ibuprofen.  3. Bring someone to drive you home with you to your appointment.  You will be at the office approximately 2 to 3 hours.  4. Wear comfortable, loose fitting clothing.  5. DAY OF PROCEDURE  OK for light breakfast. No Motrin this am.   Drink plenty of fluids prior to your procedure and throughout the day   No driving that day  Bring someone with you the day of the appointment that can drive you home.     6. Following procedure:  You may experience cramping for 12-24 hours after treatment.  Take Motrin and/or Norco (for breakthrough pain) every 4-8 hours as prescribed.  You may resume normal activity tomorrow.  You may resume sexual intercourse in 1 week or after discharge has completely resolved.  You may resume using tampons tomorrow.  Expect to experience vaginal discharge after the treatment 6-10 days.  It is described as bloody during the first few days; pinkish by approximately one week; then profuse and water thereafter.  If not already scheduled, please schedule your follow-up appointment 1-2 weeks from today.  7. Call for:  Temperature more than 100.6  Bleeding more than a period  Pain not relieved by pain medication

## 2019-01-14 ENCOUNTER — Telehealth: Payer: Self-pay | Admitting: Obstetrics & Gynecology

## 2019-01-14 NOTE — Telephone Encounter (Signed)
Patient is aware of in-office Endosee hysteroscopy with endometrial biopsy on Friday, 01/30/19 @ 1:30pm w/ Dr. Tiburcio Pea.

## 2019-01-14 NOTE — Telephone Encounter (Signed)
-----   Message from Nadara Mustard, MD sent at 01/13/2019  4:57 PM EDT ----- Regarding: in office procedure Schedule in office Endosee hysteroscopy with endometrial biopsy. This needs to be in procedure room, and Crystal can help with the procedure for now, until others are trained. We have the equipment, just need the time, and possible prior authorization for the procedure.

## 2019-01-19 NOTE — Telephone Encounter (Signed)
Patient rescheduled procedure to this Friday, 01/23/19 @ 8:10am, and is aware to call the office when she arrives, wait in car, and she will receive a call back when it is time to enter the office.

## 2019-01-20 ENCOUNTER — Telehealth: Payer: Self-pay | Admitting: Obstetrics & Gynecology

## 2019-01-20 NOTE — Telephone Encounter (Signed)
Approximate costs and [employer] insurance info were discussed with patient. °

## 2019-01-20 NOTE — Telephone Encounter (Signed)
Approximate costs and [employer] were discussed with patient.

## 2019-01-23 ENCOUNTER — Other Ambulatory Visit (HOSPITAL_COMMUNITY)
Admission: RE | Admit: 2019-01-23 | Discharge: 2019-01-23 | Disposition: A | Discharge status: Home / Self Care | Payer: BC Managed Care – PPO | Source: Ambulatory Visit | Attending: Obstetrics & Gynecology | Admitting: Obstetrics & Gynecology

## 2019-01-23 ENCOUNTER — Encounter: Payer: Self-pay | Admitting: Obstetrics & Gynecology

## 2019-01-23 ENCOUNTER — Other Ambulatory Visit: Payer: Self-pay

## 2019-01-23 ENCOUNTER — Telehealth: Payer: Self-pay

## 2019-01-23 ENCOUNTER — Ambulatory Visit (INDEPENDENT_AMBULATORY_CARE_PROVIDER_SITE_OTHER): Payer: BC Managed Care – PPO | Admitting: Obstetrics & Gynecology

## 2019-01-23 ENCOUNTER — Ambulatory Visit: Payer: BC Managed Care – PPO

## 2019-01-23 VITALS — BP 130/80 | Ht 65.0 in | Wt 234.0 lb

## 2019-01-23 DIAGNOSIS — R9389 Abnormal findings on diagnostic imaging of other specified body structures: Secondary | ICD-10-CM | POA: Diagnosis not present

## 2019-01-23 DIAGNOSIS — N921 Excessive and frequent menstruation with irregular cycle: Secondary | ICD-10-CM | POA: Diagnosis not present

## 2019-01-23 DIAGNOSIS — N840 Polyp of corpus uteri: Secondary | ICD-10-CM

## 2019-01-23 NOTE — Progress Notes (Signed)
Operative Note  01/23/2019  PRE-OP DIAGNOSIS: Metrorrhagia, endometrial thickening  POST-OP DIAGNOSIS: same, with endometrial polyp  SURGEON: Annamarie Major, MD, FACOG  PROCEDURE:  Hysteroscopy with endometrial biopsy  ANESTHESIA: Local  ESTIMATED BLOOD LOSS: None   SPECIMENS:  Endometrial biopsy  COMPLICATIONS: None  DISPOSITION: Recovered well in room  CONDITION: stable  FINDINGS: Exam under anesthesia revealed small, mobile  uterus with no masses and bilateral adnexa without masses or fullness. Hysteroscopy revealed an endometrial polyp w base on posterior wall of uterus, with an otherwise grossly normal appearing uterine cavity with bilateral tubal ostia and normal appearing endocervical canal.  PROCEDURE IN DETAIL: After informed consent was obtained, the patient was taken to the procedure room where local Lidocaine anesthesia was administered through a cervical block (20 mL with 5 mL at the 2,4,8,10 o'clock position of the cervix.  The patient was examined under anesthesia, with the above noted findings. The speculum was placed inside the patient's vagina, and the the anterior lip of the cervix was seen and grasped with the tenaculum. The uterine cavity was sounded to 8cm, and then the cervix was progressively dilated to a 12French-Pratt dilator. The Endosee hysteroscope was introduced, with saline fluid used to distend the intrauterine cavity, with the above noted findings.  The hystersocope was removed. Excellent hemostasis was noted, and all instruments were removed, with excellent hemostasis noted throughout.   Pipelle endometrial biopsy was performed using aseptic technique with iodine preparation.  Adequate sampling was obtained with minimal blood loss.  The patient tolerated the procedure well.  Disposition will be pending pathology.  She was then taken out of dorsal lithotomy.  Of note, pt had reaction to Lidocaine injection with sx's of metallic taste in mouth followed by  heavy arms and difficulty breathing.  This also led to anxiety.  She was placed on O2 and vital signs monitored.  Due to her anxiety and breathing concerns, EMS was called and came to see patient.  Their assessment was reassuring and was not in need to go to ER, although this was still offered to patient, who said she was OK.  Further monitoring led to improved patient status.  She was discharged home breathing freely and without other side effects.  Instructions given for post-hysteroscopy care.  Annamarie Major, MD, [facility], [facility] 01/23/2019  9:27 AM

## 2019-01-23 NOTE — Telephone Encounter (Signed)
Spoke w/patient to follow up from [facility] this morning. Patient states she went home & took a long nap & has just awakened. She is feeling much better. No problems indicated.

## 2019-01-23 NOTE — Addendum Note (Signed)
Addended by: Nadara Mustard on: 01/23/2019 01:44 PM   Modules accepted: Orders

## 2019-01-23 NOTE — Patient Instructions (Signed)
Hysteroscopy, Care After  This sheet gives you information about how to care for yourself after your procedure. Your health care provider may also give you more specific instructions. If you have problems or questions, contact your health care provider.  What can I expect after the procedure?  After the procedure, it is common to have:  · Cramping.  · Bleeding. This can vary from light spotting to menstrual-like bleeding.  Follow these instructions at home:  Activity  · Rest for 1-2 days after the procedure.  · Do not douche, use tampons, or have sex for 2 weeks after the procedure, or until your health care provider approves.  · Do not drive for 24 hours after the procedure, or for as long as told by your health care provider.  · Do not drive, use heavy machinery, or drink alcohol while taking prescription pain medicines.  Medicines    · Take over-the-counter and prescription medicines only as told by your health care provider.  · Do not take aspirin during recovery. It can increase the risk of bleeding.  General instructions  · Do not take baths, swim, or use a hot tub until your health care provider approves. Take showers instead of baths for 2 weeks, or for as long as told by your health care provider.  · To prevent or treat constipation while you are taking prescription pain medicine, your health care provider may recommend that you:  ? Drink enough fluid to keep your urine clear or pale yellow.  ? Take over-the-counter or prescription medicines.  ? Eat foods that are high in fiber, such as fresh fruits and vegetables, whole grains, and beans.  ? Limit foods that are high in fat and processed sugars, such as fried and sweet foods.  · Keep all follow-up visits as told by your health care provider. This is important.  Contact a health care provider if:  · You feel dizzy or lightheaded.  · You feel nauseous.  · You have abnormal vaginal discharge.  · You have a rash.  · You have pain that does not get better with  medicine.  · You have chills.  Get help right away if:  · You have bleeding that is heavier than a normal menstrual period.  · You have a fever.  · You have pain or cramps that get worse.  · You develop new abdominal pain.  · You faint.  · You have pain in your shoulders.  · You have shortness of breath.  Summary  · After the procedure, you may have cramping and some vaginal bleeding.  · Do not douche, use tampons, or have sex for 2 weeks after the procedure, or until your health care provider approves.  · Do not take baths, swim, or use a hot tub until your health care provider approves. Take showers instead of baths for 2 weeks, or for as long as told by your health care provider.  · Report any unusual symptoms to your health care provider.  · Keep all follow-up visits as told by your health care provider. This is important.  This information is not intended to replace advice given to you by your health care provider. Make sure you discuss any questions you have with your health care provider.  Document Released: 08/05/2013 Document Revised: 11/13/2016 Document Reviewed: 11/13/2016  Elsevier Interactive Patient Education © 2019 [employer]

## 2019-01-27 NOTE — Progress Notes (Signed)
SCHEDULE IN FUTURE WHEN RESTRICTIONS LIFTED:  Surgery Booking Request Patient Full Name:  Tonya Castro  MRN: [id number]  DOB: [birth year]  Surgeon: Letitia Libra, MD  Requested Surgery Date and Time: Any future date Primary Diagnosis AND Code: Endometrial polyp, Metrorrhagia Secondary Diagnosis and Code:  Surgical Procedure: Hysteroscopy D&C L&D Notification: No Admission Status: same day surgery Length of Surgery: 20 min Special Case Needs: TruClear H&P: yes (date) Phone Interview???: yes Interpreter: Language:  Medical Clearance: no Special Scheduling Instructions: no

## 2019-01-30 ENCOUNTER — Ambulatory Visit: Payer: BC Managed Care – PPO | Admitting: Obstetrics & Gynecology

## 2019-03-10 ENCOUNTER — Telehealth: Payer: Self-pay | Admitting: Obstetrics & Gynecology

## 2019-03-10 NOTE — Telephone Encounter (Signed)
Patient is aware of H&P at [facility] on 03/19/19 @ 4:30pm w/ Dr. Tiburcio Pea, Pre-admit Testing and COVID testing to be scheduled, and OR on 03/31/19. Patient is aware she may receive calls from the [facility] and [facility]. Patient confirmed [employer]BS. Patient would like to know recovery time, intends to take off day of surgery plus one day, works as a Engineer, civil (consulting).

## 2019-03-11 NOTE — Telephone Encounter (Signed)
Patient is aware that is the correct recovery time, per Dr. Tiburcio Pea.

## 2019-03-19 ENCOUNTER — Ambulatory Visit (INDEPENDENT_AMBULATORY_CARE_PROVIDER_SITE_OTHER): Payer: BC Managed Care – PPO | Admitting: Obstetrics & Gynecology

## 2019-03-19 ENCOUNTER — Other Ambulatory Visit: Payer: Self-pay

## 2019-03-19 ENCOUNTER — Encounter: Payer: Self-pay | Admitting: Obstetrics & Gynecology

## 2019-03-19 VITALS — BP 132/82 | Wt 239.0 lb

## 2019-03-19 DIAGNOSIS — N840 Polyp of corpus uteri: Secondary | ICD-10-CM | POA: Diagnosis not present

## 2019-03-19 DIAGNOSIS — N921 Excessive and frequent menstruation with irregular cycle: Secondary | ICD-10-CM

## 2019-03-19 NOTE — Progress Notes (Signed)
PRE-OPERATIVE HISTORY AND PHYSICAL EXAM  HPI:  Tonya Castro is a 43 y.o. [id number] No LMP recorded.; she is being admitted for surgery related to abnormal uterine bleeding. She complains of metrorrhagia (midcycle).  This has continued even since her diagnostic hysteroscopy in office in Mar 2020. Her menses are regular every 28-30 days, lasting 7days, increased from 5. Heavier flow this yr. Dysmenorrhea mod, occurring first 1-2 days of flow. [place] hasintermenstrual bleeding most months for the past year.  Recent ultrasound revealed thickened endometrium and no fibroids.  Hysteroscopy revealed polyp.  Biopsy confirmed benign polyp.  PMHx: Past Medical History:  Diagnosis Date  . Anxiety   . Essential hypertension 09/24/2017  . External hemorrhoid   . Obesity (BMI 35.0-39.9 without comorbidity)    Past Surgical History:  Procedure Laterality Date  . BREAST BIOPSY Left    x2- neg / clips  . CESAREAN SECTION    . CYSTOSCOPY N/A 03/14/2015   Procedure: CYSTOSCOPY;  Surgeon: Conard NovakStephen D Jackson, MD;  Location: [facility] ORS;  Service: Gynecology;  Laterality: N/A;  . HYSTEROSCOPY N/A 03/14/2015   Procedure: HYSTEROSCOPY;  Surgeon: Conard NovakStephen D Jackson, MD;  Location: [facility] OR[facility];  Service: Gynecology;  Laterality: N/A;  . IUD REMOVAL N/A 03/14/2015   Procedure: INTRAUTERINE DEVICE (IUD) REMOVAL;  Surgeon: Conard NovakStephen D Jackson, MD;  Location: [facility] ORS;  Service: Gynecology;  Laterality: N/A;  . LAPAROSCOPY N/A 03/14/2015   Procedure: LAPAROSCOPY DIAGNOSTIC;  Surgeon: Conard NovakStephen D Jackson, MD;  Location: [facility] ORS;  Service: Gynecology;  Laterality: N/A;  . MM BREAST STEREO BIOPSY LEFT ([facility] HX)     [id number],2011  . TUBAL LIGATION Bilateral 03/14/2015   Procedure: BILATERAL TUBAL LIGATION;  Surgeon: Conard NovakStephen D Jackson, MD;  Location: [facility] ORS;  Service: Gynecology;  Laterality: Bilateral;   Family History  Problem Relation Age of Onset  . Skin cancer Paternal Grandfather [id number]  . Skin cancer Paternal Uncle 40   . Breast cancer Neg Hx    Social History   Tobacco Use  . Smoking status: Never Smoker  . Smokeless tobacco: Never Used  Substance Use Topics  . Alcohol use: No  . Drug use: No    Current Outpatient Medications:  .  lisinopril (PRINIVIL,ZESTRIL) 10 MG tablet, Take 1 tablet (10 mg total) by mouth daily. (Patient taking differently: Take 10 mg by mouth every evening. ), Disp: 90 tablet, Rfl: 3 .  traZODone (DESYREL) 50 MG tablet, Take 50 mg by mouth at bedtime. , Disp: , Rfl: 1 .  ALPRAZolam (XANAX) 0.5 MG tablet, Take 0.5 mg by mouth daily as needed for anxiety., Disp: , Rfl:  .  HYDROcodone-acetaminophen (NORCO) 5-325 MG tablet, Take 1 tablet by mouth every 6 (six) hours as needed for moderate pain. Take one hour prior to procedure (Patient not taking: Reported on 03/18/2019), Disp: 10 tablet, Rfl: 0 .  hydrocortisone-pramoxine (ANALPRAM-HC) 2.5-1 % rectal cream, Place 1 application rectally 3 (three) times daily. (Patient not taking: Reported on 03/18/2019), Disp: 30 g, Rfl: 0 .  ibuprofen (ADVIL) 200 MG tablet, Take 600 mg by mouth every 6 (six) hours as needed for headache or moderate pain., Disp: , Rfl:  .  misoprostol (CYTOTEC) 200 MCG tablet, Take 1 tablet (200 mcg total) by mouth once for 1 dose. Night before procedure (Patient not taking: Reported on 03/18/2019), Disp: 1 tablet, Rfl: 0 .  promethazine (PHENERGAN) 25 MG tablet, Take 1 tablet (25 mg total) by mouth once for 1 dose. One hour before procedure (  Patient not taking: Reported on 03/18/2019), Disp: 5 tablet, Rfl: 0 .  VITAMIN D PO, Take 1 tablet by mouth daily., Disp: , Rfl:  Allergies: Amoxicillin and Vancomycin  Review of Systems  Constitutional: Negative for chills, fever and malaise/fatigue.  HENT: Negative for congestion, sinus pain and sore throat.   Eyes: Negative for blurred vision and pain.  Respiratory: Negative for cough and wheezing.   Cardiovascular: Negative for chest pain and leg swelling.  Gastrointestinal:  Negative for abdominal pain, constipation, diarrhea, heartburn, nausea and vomiting.  Genitourinary: Negative for dysuria, frequency, hematuria and urgency.  Musculoskeletal: Negative for back pain, joint pain, myalgias and neck pain.  Skin: Negative for itching and rash.  Neurological: Negative for dizziness, tremors and weakness.  Endo/Heme/Allergies: Does not bruise/bleed easily.  Psychiatric/Behavioral: Negative for depression. The patient is not nervous/anxious and does not have insomnia.     Objective: BP 132/82   Wt 239 lb (108.4 kg)   BMI 39.77 kg/m   Filed Weights   03/19/19 1628  Weight: 239 lb (108.4 kg)   Physical Exam Constitutional:      General: She is not in acute distress.    Appearance: She is well-developed.  HENT:     Head: Normocephalic and atraumatic. No laceration.     Right Ear: Hearing normal.     Left Ear: Hearing normal.     Mouth/Throat:     Pharynx: Uvula midline.  Eyes:     Pupils: Pupils are equal, round, and reactive to light.  Neck:     Musculoskeletal: Normal range of motion and neck supple.     Thyroid: No thyromegaly.  Cardiovascular:     Rate and Rhythm: Normal rate and regular rhythm.     Heart sounds: No murmur. No friction rub. No gallop.   Pulmonary:     Effort: Pulmonary effort is normal. No respiratory distress.     Breath sounds: Normal breath sounds. No wheezing.  Chest:     Breasts:        Right: No mass, skin change or tenderness.        Left: No mass, skin change or tenderness.  Abdominal:     General: Bowel sounds are normal. There is no distension.     Palpations: Abdomen is soft.     Tenderness: There is no abdominal tenderness. There is no rebound.  Musculoskeletal: Normal range of motion.  Neurological:     Mental Status: She is alert and oriented to person, place, and time.     Cranial Nerves: No cranial nerve deficit.  Skin:    General: Skin is warm and dry.  Psychiatric:        Judgment: Judgment normal.   Vitals signs reviewed.     Assessment: 1. Endometrial polyp   2. Metrorrhagia   Plan hysteroscopy with polypectomy  I have had a careful discussion with this patient about all the options available and the risk/benefits of each. I have fully informed this patient that surgery may subject her to a variety of discomforts and risks: She understands that most patients have surgery with little difficulty, but problems can happen ranging from minor to fatal. These include nausea, vomiting, pain, bleeding, infection, poor healing, hernia, or formation of adhesions. Unexpected reactions may occur from any drug or anesthetic given. Unintended injury may occur to other pelvic or abdominal structures such as Fallopian tubes, ovaries, bladder, ureter (tube from kidney to bladder), or bowel. Nerves going from the pelvis to the legs may  be injured. Any such injury may require immediate or later additional surgery to correct the problem. Excessive blood loss requiring transfusion is very unlikely but possible. Dangerous blood clots may form in the legs or lungs. Physical and sexual activity will be restricted in varying degrees for an indeterminate period of time but most often 2-6 weeks.  Finally, she understands that it is impossible to list every possible undesirable effect and that the condition for which surgery is done is not always cured or significantly improved, and in rare cases may be even worse.Ample time was given to answer all questions.  Annamarie Major, MD, [facility], [facility] 03/19/2019  4:39 PM

## 2019-03-19 NOTE — Patient Instructions (Signed)
Hysteroscopy, Care After  This sheet gives you information about how to care for yourself after your procedure. Your health care provider may also give you more specific instructions. If you have problems or questions, contact your health care provider.  What can I expect after the procedure?  After the procedure, it is common to have:  · Cramping.  · Bleeding. This can vary from light spotting to menstrual-like bleeding.  Follow these instructions at home:  Activity  · Rest for 1-2 days after the procedure.  · Do not douche, use tampons, or have sex for 2 weeks after the procedure, or until your health care provider approves.  · Do not drive for 24 hours after the procedure, or for as long as told by your health care provider.  · Do not drive, use heavy machinery, or drink alcohol while taking prescription pain medicines.  Medicines    · Take over-the-counter and prescription medicines only as told by your health care provider.  · Do not take aspirin during recovery. It can increase the risk of bleeding.  General instructions  · Do not take baths, swim, or use a hot tub until your health care provider approves. Take showers instead of baths for 2 weeks, or for as long as told by your health care provider.  · To prevent or treat constipation while you are taking prescription pain medicine, your health care provider may recommend that you:  ? Drink enough fluid to keep your urine clear or pale yellow.  ? Take over-the-counter or prescription medicines.  ? Eat foods that are high in fiber, such as fresh fruits and vegetables, whole grains, and beans.  ? Limit foods that are high in fat and processed sugars, such as fried and sweet foods.  · Keep all follow-up visits as told by your health care provider. This is important.  Contact a health care provider if:  · You feel dizzy or lightheaded.  · You feel nauseous.  · You have abnormal vaginal discharge.  · You have a rash.  · You have pain that does not get better with  medicine.  · You have chills.  Get help right away if:  · You have bleeding that is heavier than a normal menstrual period.  · You have a fever.  · You have pain or cramps that get worse.  · You develop new abdominal pain.  · You faint.  · You have pain in your shoulders.  · You have shortness of breath.  Summary  · After the procedure, you may have cramping and some vaginal bleeding.  · Do not douche, use tampons, or have sex for 2 weeks after the procedure, or until your health care provider approves.  · Do not take baths, swim, or use a hot tub until your health care provider approves. Take showers instead of baths for 2 weeks, or for as long as told by your health care provider.  · Report any unusual symptoms to your health care provider.  · Keep all follow-up visits as told by your health care provider. This is important.  This information is not intended to replace advice given to you by your health care provider. Make sure you discuss any questions you have with your health care provider.  Document Released: 08/05/2013 Document Revised: 11/13/2016 Document Reviewed: 11/13/2016  Elsevier Interactive Patient Education © 2019 [employer]

## 2019-03-19 NOTE — H&P (View-Only) (Signed)
PRE-OPERATIVE HISTORY AND PHYSICAL EXAM  HPI:  Tonya Castro is a 43 y.o. [id number] No LMP recorded.; she is being admitted for surgery related to abnormal uterine bleeding. She complains of metrorrhagia (midcycle).  This has continued even since her diagnostic hysteroscopy in office in Mar 2020. Her menses are regular every 28-30 days, lasting 7days, increased from 5. Heavier flow this yr. Dysmenorrhea mod, occurring first 1-2 days of flow. [place] hasintermenstrual bleeding most months for the past year.  Recent ultrasound revealed thickened endometrium and no fibroids.  Hysteroscopy revealed polyp.  Biopsy confirmed benign polyp.  PMHx: Past Medical History:  Diagnosis Date  . Anxiety   . Essential hypertension 09/24/2017  . External hemorrhoid   . Obesity (BMI 35.0-39.9 without comorbidity)    Past Surgical History:  Procedure Laterality Date  . BREAST BIOPSY Left    x2- neg / clips  . CESAREAN SECTION    . CYSTOSCOPY N/A 03/14/2015   Procedure: CYSTOSCOPY;  Surgeon: Conard NovakStephen D Jackson, MD;  Location: [facility] OR[facility];  Service: Gynecology;  Laterality: N/A;  . HYSTEROSCOPY N/A 03/14/2015   Procedure: HYSTEROSCOPY;  Surgeon: Conard NovakStephen D Jackson, MD;  Location: [facility];  Service: Gynecology;  Laterality: N/A;  . IUD REMOVAL N/A 03/14/2015   Procedure: INTRAUTERINE DEVICE (IUD) REMOVAL;  Surgeon: Conard NovakStephen D Jackson, MD;  Location: [facility] OR[facility];  Service: Gynecology;  Laterality: N/A;  . LAPAROSCOPY N/A 03/14/2015   Procedure: LAPAROSCOPY DIAGNOSTIC;  Surgeon: Conard NovakStephen D Jackson, MD;  Location: [facility];  Service: Gynecology;  Laterality: N/A;  . MM BREAST STEREO BIOPSY LEFT ([facility] HX)     [id number],2011  . TUBAL LIGATION Bilateral 03/14/2015   Procedure: BILATERAL TUBAL LIGATION;  Surgeon: Conard NovakStephen D Jackson, MD;  Location: [facility] OR[facility];  Service: Gynecology;  Laterality: Bilateral;   Family History  Problem Relation Age of Onset  . Skin cancer Paternal Grandfather [id number]  . Skin cancer Paternal Uncle 40   . Breast cancer Neg Hx    Social History   Tobacco Use  . Smoking status: Never Smoker  . Smokeless tobacco: Never Used  Substance Use Topics  . Alcohol use: No  . Drug use: No    Current Outpatient Medications:  .  lisinopril (PRINIVIL,ZESTRIL) 10 MG tablet, Take 1 tablet (10 mg total) by mouth daily. (Patient taking differently: Take 10 mg by mouth every evening. ), Disp: 90 tablet, Rfl: 3 .  traZODone (DESYREL) 50 MG tablet, Take 50 mg by mouth at bedtime. , Disp: , Rfl: 1 .  ALPRAZolam (XANAX) 0.5 MG tablet, Take 0.5 mg by mouth daily as needed for anxiety., Disp: , Rfl:  .  HYDROcodone-acetaminophen (NORCO) 5-325 MG tablet, Take 1 tablet by mouth every 6 (six) hours as needed for moderate pain. Take one hour prior to procedure (Patient not taking: Reported on 03/18/2019), Disp: 10 tablet, Rfl: 0 .  hydrocortisone-pramoxine (ANALPRAM-HC) 2.5-1 % rectal cream, Place 1 application rectally 3 (three) times daily. (Patient not taking: Reported on 03/18/2019), Disp: 30 g, Rfl: 0 .  ibuprofen (ADVIL) 200 MG tablet, Take 600 mg by mouth every 6 (six) hours as needed for headache or moderate pain., Disp: , Rfl:  .  misoprostol (CYTOTEC) 200 MCG tablet, Take 1 tablet (200 mcg total) by mouth once for 1 dose. Night before procedure (Patient not taking: Reported on 03/18/2019), Disp: 1 tablet, Rfl: 0 .  promethazine (PHENERGAN) 25 MG tablet, Take 1 tablet (25 mg total) by mouth once for 1 dose. One hour before procedure (  Patient not taking: Reported on 03/18/2019), Disp: 5 tablet, Rfl: 0 .  VITAMIN D PO, Take 1 tablet by mouth daily., Disp: , Rfl:  Allergies: Amoxicillin and Vancomycin  Review of Systems  Constitutional: Negative for chills, fever and malaise/fatigue.  HENT: Negative for congestion, sinus pain and sore throat.   Eyes: Negative for blurred vision and pain.  Respiratory: Negative for cough and wheezing.   Cardiovascular: Negative for chest pain and leg swelling.  Gastrointestinal:  Negative for abdominal pain, constipation, diarrhea, heartburn, nausea and vomiting.  Genitourinary: Negative for dysuria, frequency, hematuria and urgency.  Musculoskeletal: Negative for back pain, joint pain, myalgias and neck pain.  Skin: Negative for itching and rash.  Neurological: Negative for dizziness, tremors and weakness.  Endo/Heme/Allergies: Does not bruise/bleed easily.  Psychiatric/Behavioral: Negative for depression. The patient is not nervous/anxious and does not have insomnia.     Objective: BP 132/82   Wt 239 lb (108.4 kg)   BMI 39.77 kg/m   Filed Weights   03/19/19 1628  Weight: 239 lb (108.4 kg)   Physical Exam Constitutional:      General: She is not in acute distress.    Appearance: She is well-developed.  HENT:     Head: Normocephalic and atraumatic. No laceration.     Right Ear: Hearing normal.     Left Ear: Hearing normal.     Mouth/Throat:     Pharynx: Uvula midline.  Eyes:     Pupils: Pupils are equal, round, and reactive to light.  Neck:     Musculoskeletal: Normal range of motion and neck supple.     Thyroid: No thyromegaly.  Cardiovascular:     Rate and Rhythm: Normal rate and regular rhythm.     Heart sounds: No murmur. No friction rub. No gallop.   Pulmonary:     Effort: Pulmonary effort is normal. No respiratory distress.     Breath sounds: Normal breath sounds. No wheezing.  Chest:     Breasts:        Right: No mass, skin change or tenderness.        Left: No mass, skin change or tenderness.  Abdominal:     General: Bowel sounds are normal. There is no distension.     Palpations: Abdomen is soft.     Tenderness: There is no abdominal tenderness. There is no rebound.  Musculoskeletal: Normal range of motion.  Neurological:     Mental Status: She is alert and oriented to person, place, and time.     Cranial Nerves: No cranial nerve deficit.  Skin:    General: Skin is warm and dry.  Psychiatric:        Judgment: Judgment normal.   Vitals signs reviewed.     Assessment: 1. Endometrial polyp   2. Metrorrhagia   Plan hysteroscopy with polypectomy  I have had a careful discussion with this patient about all the options available and the risk/benefits of each. I have fully informed this patient that surgery may subject her to a variety of discomforts and risks: She understands that most patients have surgery with little difficulty, but problems can happen ranging from minor to fatal. These include nausea, vomiting, pain, bleeding, infection, poor healing, hernia, or formation of adhesions. Unexpected reactions may occur from any drug or anesthetic given. Unintended injury may occur to other pelvic or abdominal structures such as Fallopian tubes, ovaries, bladder, ureter (tube from kidney to bladder), or bowel. Nerves going from the pelvis to the legs may   be injured. Any such injury may require immediate or later additional surgery to correct the problem. Excessive blood loss requiring transfusion is very unlikely but possible. Dangerous blood clots may form in the legs or lungs. Physical and sexual activity will be restricted in varying degrees for an indeterminate period of time but most often 2-6 weeks.  Finally, she understands that it is impossible to list every possible undesirable effect and that the condition for which surgery is done is not always cured or significantly improved, and in rare cases may be even worse.Ample time was given to answer all questions.  Annamarie Major, MD, [facility], [facility] 03/19/2019  4:39 PM

## 2019-03-25 ENCOUNTER — Other Ambulatory Visit: Payer: Self-pay

## 2019-03-25 ENCOUNTER — Encounter
Admission: RE | Admit: 2019-03-25 | Discharge: 2019-03-25 | Disposition: A | Discharge status: Home / Self Care | Payer: BC Managed Care – PPO | Source: Ambulatory Visit | Attending: Obstetrics & Gynecology | Admitting: Obstetrics & Gynecology

## 2019-03-25 HISTORY — DX: Nausea with vomiting, unspecified: R11.2

## 2019-03-25 HISTORY — DX: Other specified postprocedural states: Z98.890

## 2019-03-25 HISTORY — DX: Personal history of other diseases of the female genital tract: Z87.42

## 2019-03-25 NOTE — Patient Instructions (Signed)
Your procedure is scheduled on: 03/31/2019 Tues Report to Same Day Surgery 2nd floor medical mall [facility] Entrance-take elevator on left to 2nd floor.  Check in with surgery information desk.) To find out your arrival time please call [phone] between 1PM - 3PM on 03/30/2019 Mon  Remember: Instructions that are not followed completely may result in serious medical risk, up to and including death, or upon the discretion of your surgeon and anesthesiologist your surgery may need to be rescheduled.    _x___ 1. Do not eat food after midnight the night before your procedure. You may drink clear liquids up to 2 hours before you are scheduled to arrive at the hospital for your procedure.  Do not drink clear liquids within 2 hours of your scheduled arrival to the hospital.  Clear liquids include  --Water or Apple juice without pulp  --Clear carbohydrate beverage such as ClearFast or Gatorade  --Black Coffee or Clear Tea (No milk, no creamers, do not add anything to                  the coffee or Tea Type 1 and type 2 diabetics should only drink water.   ____Ensure clear carbohydrate drink on the way to the hospital for bariatric patients  _x___Ensure clear carbohydrate drink 3 hours before surgery    No gum chewing or hard candies.     __x__ 2. No Alcohol for 24 hours before or after surgery.   __x__3. No Smoking or e-cigarettes for 24 prior to surgery.  Do not use any chewable tobacco products for at least 6 hour prior to surgery   ____  4. Bring all medications with you on the day of surgery if instructed.    __x__ 5. Notify your doctor if there is any change in your medical condition     (cold, fever, infections).    x___6. On the morning of surgery brush your teeth with toothpaste and water.  You may rinse your mouth with mouth wash if you wish.  Do not swallow any toothpaste or mouthwash.   Do not wear jewelry, make-up, hairpins, clips or nail polish.  Do not wear lotions,  powders, or perfumes. You may wear deodorant.  Do not shave 48 hours prior to surgery. Men may shave face and neck.  Do not bring valuables to the hospital.    [facility] is not responsible for any belongings or valuables.               Contacts, dentures or bridgework may not be worn into surgery.  Leave your suitcase in the car. After surgery it may be brought to your room.  For patients admitted to the hospital, discharge time is determined by your                       treatment team.  _  Patients discharged the day of surgery will not be allowed to drive home.  You will need someone to drive you home and stay with you the night of your procedure.    Please read over the following fact sheets that you were given:   [facility] Preparing for Surgery and or MRSA Information   _x___ Take anti-hypertensive listed below, cardiac, seizure, asthma,     anti-reflux and psychiatric medicines. These include:  1. ALPRAZolam (XANAX) if needed  2.  3.  4.  5.  6.  ____Fleets enema or Magnesium Citrate as directed.   _x___  Use CHG Soap or sage wipes as directed on instruction sheet   ____ Use inhalers on the day of surgery and bring to hospital day of surgery  ____ Stop Metformin and Janumet 2 days prior to surgery.    ____ Take 1/2 of usual insulin dose the night before surgery and none on the morning     surgery.   _x___ Follow recommendations from Cardiologist, Pulmonologist or PCP regarding          stopping Aspirin, Coumadin, Plavix ,Eliquis, Effient, or Pradaxa, and Pletal.  X____Stop Anti-inflammatories such as Advil, Aleve, Ibuprofen, Motrin, Naproxen, Naprosyn, Goodies powders or aspirin products. OK to take Tylenol and                          Celebrex.   _x___ Stop supplements until after surgery.  But may continue Vitamin D, Vitamin B,       and multivitamin.   ____ Bring C-Pap to the hospital.

## 2019-03-26 ENCOUNTER — Encounter
Admission: RE | Admit: 2019-03-26 | Discharge: 2019-03-26 | Disposition: A | Discharge status: Home / Self Care | Payer: BC Managed Care – PPO | Source: Ambulatory Visit | Attending: Obstetrics & Gynecology | Admitting: Obstetrics & Gynecology

## 2019-03-26 DIAGNOSIS — Z01818 Encounter for other preprocedural examination: Secondary | ICD-10-CM | POA: Diagnosis present

## 2019-03-26 DIAGNOSIS — I10 Essential (primary) hypertension: Secondary | ICD-10-CM | POA: Insufficient documentation

## 2019-03-26 LAB — CBC
HCT: 39.1 % (ref 36.0–46.0)
Hemoglobin: 12.7 g/dL (ref 12.0–15.0)
MCH: 28.1 pg (ref 26.0–34.0)
MCHC: 32.5 g/dL (ref 30.0–36.0)
MCV: 86.5 fL (ref 80.0–100.0)
Platelets: 302 10*3/uL (ref 150–400)
RBC: 4.52 MIL/uL (ref 3.87–5.11)
RDW: 13.4 % (ref 11.5–15.5)
WBC: 8 10*3/uL (ref 4.0–10.5)
nRBC: 0 % (ref 0.0–0.2)

## 2019-03-26 LAB — TYPE AND SCREEN
ABO/RH(D): O POS
Antibody Screen: NEGATIVE

## 2019-03-27 ENCOUNTER — Other Ambulatory Visit: Payer: BC Managed Care – PPO

## 2019-03-31 ENCOUNTER — Ambulatory Visit: Payer: BC Managed Care – PPO | Admitting: Anesthesiology

## 2019-03-31 ENCOUNTER — Other Ambulatory Visit: Payer: Self-pay

## 2019-03-31 ENCOUNTER — Other Ambulatory Visit
Admission: RE | Admit: 2019-03-31 | Discharge: 2019-03-31 | Disposition: A | Discharge status: Home / Self Care | Payer: BC Managed Care – PPO | Source: Ambulatory Visit | Attending: Obstetrics & Gynecology | Admitting: Obstetrics & Gynecology

## 2019-03-31 ENCOUNTER — Encounter
Admission: RE | Disposition: A | Discharge status: Home / Self Care | Payer: Self-pay | Source: Home / Self Care | Attending: Obstetrics & Gynecology

## 2019-03-31 ENCOUNTER — Ambulatory Visit
Admission: RE | Admit: 2019-03-31 | Discharge: 2019-03-31 | Disposition: A | Discharge status: Home / Self Care | Payer: BC Managed Care – PPO | Attending: Obstetrics & Gynecology | Admitting: Obstetrics & Gynecology

## 2019-03-31 DIAGNOSIS — Z6839 Body mass index (BMI) 39.0-39.9, adult: Secondary | ICD-10-CM | POA: Insufficient documentation

## 2019-03-31 DIAGNOSIS — Z1159 Encounter for screening for other viral diseases: Secondary | ICD-10-CM | POA: Diagnosis not present

## 2019-03-31 DIAGNOSIS — N840 Polyp of corpus uteri: Secondary | ICD-10-CM

## 2019-03-31 DIAGNOSIS — Z881 Allergy status to other antibiotic agents status: Secondary | ICD-10-CM | POA: Insufficient documentation

## 2019-03-31 DIAGNOSIS — Z880 Allergy status to penicillin: Secondary | ICD-10-CM | POA: Insufficient documentation

## 2019-03-31 DIAGNOSIS — Z79899 Other long term (current) drug therapy: Secondary | ICD-10-CM | POA: Insufficient documentation

## 2019-03-31 DIAGNOSIS — E669 Obesity, unspecified: Secondary | ICD-10-CM | POA: Diagnosis not present

## 2019-03-31 DIAGNOSIS — N921 Excessive and frequent menstruation with irregular cycle: Secondary | ICD-10-CM

## 2019-03-31 DIAGNOSIS — F419 Anxiety disorder, unspecified: Secondary | ICD-10-CM | POA: Insufficient documentation

## 2019-03-31 DIAGNOSIS — N946 Dysmenorrhea, unspecified: Secondary | ICD-10-CM | POA: Insufficient documentation

## 2019-03-31 DIAGNOSIS — R9389 Abnormal findings on diagnostic imaging of other specified body structures: Secondary | ICD-10-CM | POA: Diagnosis present

## 2019-03-31 DIAGNOSIS — K644 Residual hemorrhoidal skin tags: Secondary | ICD-10-CM | POA: Insufficient documentation

## 2019-03-31 HISTORY — PX: HYSTEROSCOPY WITH D & C: SHX1775

## 2019-03-31 LAB — ABO/RH: ABO/RH(D): O POS

## 2019-03-31 LAB — POCT PREGNANCY, URINE: Preg Test, Ur: NEGATIVE

## 2019-03-31 LAB — SARS CORONAVIRUS 2 BY RT PCR (HOSPITAL ORDER, PERFORMED IN ~~LOC~~ HOSPITAL LAB): SARS Coronavirus 2: NEGATIVE

## 2019-03-31 SURGERY — DILATATION AND CURETTAGE /HYSTEROSCOPY
Anesthesia: General

## 2019-03-31 MED ORDER — MIDAZOLAM HCL 2 MG/2ML IJ SOLN
INTRAMUSCULAR | Status: DC | PRN
  Administered 2019-03-31: 2 mg via INTRAVENOUS

## 2019-03-31 MED ORDER — FENTANYL CITRATE (PF) 100 MCG/2ML IJ SOLN
INTRAMUSCULAR | Status: AC
  Filled 2019-03-31: qty 2

## 2019-03-31 MED ORDER — FAMOTIDINE 20 MG PO TABS
20.0000 mg | ORAL_TABLET | Freq: Once | ORAL | Status: AC
  Administered 2019-03-31: 10:00:00 20 mg via ORAL

## 2019-03-31 MED ORDER — OXYCODONE HCL 5 MG/5ML PO SOLN: 5.0000 mg | Freq: Once | ORAL | Status: DC | PRN

## 2019-03-31 MED ORDER — LACTATED RINGERS IV SOLN
INTRAVENOUS | Status: DC
  Administered 2019-03-31: 10:00:00 via INTRAVENOUS

## 2019-03-31 MED ORDER — MIDAZOLAM HCL 2 MG/2ML IJ SOLN
INTRAMUSCULAR | Status: AC
  Filled 2019-03-31: qty 2

## 2019-03-31 MED ORDER — LACTATED RINGERS IV SOLN: INTRAVENOUS | Status: DC

## 2019-03-31 MED ORDER — PROPOFOL 500 MG/50ML IV EMUL
INTRAVENOUS | Status: AC
  Filled 2019-03-31: qty 100

## 2019-03-31 MED ORDER — PROMETHAZINE HCL 25 MG/ML IJ SOLN: 6.2500 mg | INTRAMUSCULAR | Status: DC | PRN

## 2019-03-31 MED ORDER — ACETAMINOPHEN 325 MG PO TABS: 650.0000 mg | ORAL_TABLET | ORAL | Status: DC | PRN

## 2019-03-31 MED ORDER — LIDOCAINE HCL (PF) 2 % IJ SOLN
INTRAMUSCULAR | Status: AC
  Filled 2019-03-31: qty 10

## 2019-03-31 MED ORDER — ONDANSETRON HCL 4 MG/2ML IJ SOLN
INTRAMUSCULAR | Status: AC
  Filled 2019-03-31: qty 2

## 2019-03-31 MED ORDER — OXYCODONE-ACETAMINOPHEN 5-325 MG PO TABS: 1.0000 | ORAL_TABLET | ORAL | 0 refills | Status: DC | PRN

## 2019-03-31 MED ORDER — MORPHINE SULFATE (PF) 4 MG/ML IV SOLN: 1.0000 mg | INTRAVENOUS | Status: DC | PRN

## 2019-03-31 MED ORDER — OXYCODONE-ACETAMINOPHEN 5-325 MG PO TABS: 1.0000 | ORAL_TABLET | ORAL | Status: DC | PRN

## 2019-03-31 MED ORDER — SCOPOLAMINE 1 MG/3DAYS TD PT72
MEDICATED_PATCH | TRANSDERMAL | Status: AC
  Administered 2019-03-31: 1.5 mg
  Filled 2019-03-31: qty 1

## 2019-03-31 MED ORDER — OXYCODONE HCL 5 MG PO TABS: 5.0000 mg | ORAL_TABLET | Freq: Once | ORAL | Status: DC | PRN

## 2019-03-31 MED ORDER — FENTANYL CITRATE (PF) 100 MCG/2ML IJ SOLN
INTRAMUSCULAR | Status: DC | PRN
  Administered 2019-03-31 (×2): 50 ug via INTRAVENOUS

## 2019-03-31 MED ORDER — PROPOFOL 10 MG/ML IV BOLUS
INTRAVENOUS | Status: DC | PRN
  Administered 2019-03-31: 180 mg via INTRAVENOUS

## 2019-03-31 MED ORDER — FENTANYL CITRATE (PF) 100 MCG/2ML IJ SOLN: 25.0000 ug | INTRAMUSCULAR | Status: DC | PRN

## 2019-03-31 MED ORDER — PROPOFOL 500 MG/50ML IV EMUL
INTRAVENOUS | Status: DC | PRN
  Administered 2019-03-31: 200 ug/kg/min via INTRAVENOUS

## 2019-03-31 MED ORDER — FAMOTIDINE 20 MG PO TABS
ORAL_TABLET | ORAL | Status: AC
  Filled 2019-03-31: qty 1

## 2019-03-31 MED ORDER — KETOROLAC TROMETHAMINE 30 MG/ML IJ SOLN: 30.0000 mg | Freq: Four times a day (QID) | INTRAMUSCULAR | Status: DC

## 2019-03-31 MED ORDER — ACETAMINOPHEN 650 MG RE SUPP: 650.0000 mg | RECTAL | Status: DC | PRN

## 2019-03-31 SURGICAL SUPPLY — 16 items
BAG COUNTER SPONGE EZ (MISCELLANEOUS) ×2 IMPLANT
CATH ROBINSON RED A/P 16FR (CATHETERS) ×2 IMPLANT
COVER WAND RF STERILE (DRAPES) IMPLANT
DEVICE MYOSURE REACH (MISCELLANEOUS) ×2 IMPLANT
GLOVE BIO SURGEON STRL SZ8 (GLOVE) ×2 IMPLANT
GOWN STRL REUS W/ TWL LRG LVL3 (GOWN DISPOSABLE) ×1 IMPLANT
GOWN STRL REUS W/ TWL XL LVL3 (GOWN DISPOSABLE) ×1 IMPLANT
GOWN STRL REUS W/TWL LRG LVL3 (GOWN DISPOSABLE) ×1
GOWN STRL REUS W/TWL XL LVL3 (GOWN DISPOSABLE) ×1
KIT PROCEDURE FLUENT (KITS) ×2 IMPLANT
KIT TURNOVER CYSTO (KITS) ×2 IMPLANT
PACK DNC HYST (MISCELLANEOUS) ×2 IMPLANT
PAD OB MATERNITY 4.3X12.25 (PERSONAL CARE ITEMS) ×2 IMPLANT
PAD PREP 24X41 OB/GYN DISP (PERSONAL CARE ITEMS) ×2 IMPLANT
SEAL ROD LENS SCOPE MYOSURE (ABLATOR) ×2 IMPLANT
TOWEL OR 17X26 4PK STRL BLUE (TOWEL DISPOSABLE) ×2 IMPLANT

## 2019-03-31 NOTE — Anesthesia Post-op Follow-up Note (Signed)
Anesthesia QCDR form completed.        

## 2019-03-31 NOTE — Interval H&P Note (Signed)
History and Physical Interval Note:  03/31/2019 11:16 AM  Tonya Castro  has presented today for surgery, with the diagnosis of ENDOMETRIAL POLYP METRORRHAGIA.  The various methods of treatment have been discussed with the patient and family. After consideration of risks, benefits and other options for treatment, the patient has consented to  Procedure(s): DILATATION AND CURETTAGE /HYSTEROSCOPY POLYPECTOMY (N/A) as a surgical intervention.  The patient's history has been reviewed, patient examined, no change in status, stable for surgery.  I have reviewed the patient's chart and labs.  Questions were answered to the patient's satisfaction.     Letitia Libra

## 2019-03-31 NOTE — Anesthesia Preprocedure Evaluation (Addendum)
Anesthesia Evaluation  Patient identified by MRN, date of birth, ID band Patient awake    Reviewed: Allergy & Precautions, H&P , NPO status , Patient's Chart, lab work & pertinent test results  History of Anesthesia Complications (+) PONV and history of anesthetic complications  Airway Mallampati: II  TM Distance: >3 FB Neck ROM: full    Dental  (+) Chipped, Caps   Pulmonary neg shortness of breath, sleep apnea ,           Cardiovascular Exercise Tolerance: Good hypertension, (-) angina(-) Past MI and (-) DOE      Neuro/Psych negative neurological ROS     GI/Hepatic negative GI ROS, Neg liver ROS, neg GERD  ,  Endo/Other  negative endocrine ROS  Renal/GU      Musculoskeletal   Abdominal   Peds  Hematology negative hematology ROS (+)   Anesthesia Other Findings Past Medical History: No date: Anxiety 09/24/2017: Essential hypertension No date: External hemorrhoid No date: History of heavy vaginal bleeding No date: Obesity (BMI 35.0-39.9 without comorbidity) No date: PONV (postoperative nausea and vomiting)  Past Surgical History: No date: BREAST BIOPSY; Left     Comment:  x2- neg / clips No date: CESAREAN SECTION 03/14/2015: CYSTOSCOPY; N/A     Comment:  Procedure: CYSTOSCOPY;  Surgeon: Conard NovakStephen D Jackson, MD;               Location: [facility] ORS;  Service: Gynecology;  Laterality:               N/A; 03/14/2015: HYSTEROSCOPY; N/A     Comment:  Procedure: HYSTEROSCOPY;  Surgeon: Conard NovakStephen D Jackson,               MD;  Location: [facility] ORS;  Service: Gynecology;                Laterality: N/A; 03/14/2015: IUD REMOVAL; N/A     Comment:  Procedure: INTRAUTERINE DEVICE (IUD) REMOVAL;  Surgeon:               Conard NovakStephen D Jackson, MD;  Location: [facility] ORS;  Service:               Gynecology;  Laterality: N/A; 03/14/2015: LAPAROSCOPY; N/A     Comment:  Procedure: LAPAROSCOPY DIAGNOSTIC;  Surgeon: Conard NovakStephen D   Jackson, MD;  Location: [facility] ORS;  Service: Gynecology;                Laterality: N/A; No date: MM BREAST STEREO BIOPSY LEFT [facility]X)     Comment:  [id number] 03/14/2015: TUBAL LIGATION; Bilateral     Comment:  Procedure: BILATERAL TUBAL LIGATION;  Surgeon: Conard NovakStephen D              Jackson, MD;  Location: [facility] ORS;  Service: Gynecology;                Laterality: Bilateral;  BMI    Body Mass Index:  39.77 kg/m      Reproductive/Obstetrics negative OB ROS                             Anesthesia Physical Anesthesia Plan  ASA: III  Anesthesia Plan: General LMA   Post-op Pain Management:    Induction: Intravenous  PONV Risk Score and Plan: Dexamethasone, Ondansetron, Midazolam, Treatment may vary due to age or medical condition, Scopolamine patch - Pre-op, Propofol infusion and TIVA  Airway Management Planned: LMA  Additional Equipment:  Intra-op Plan:   Post-operative Plan: Extubation in OR  Informed Consent: I have reviewed the patients History and Physical, chart, labs and discussed the procedure including the risks, benefits and alternatives for the proposed anesthesia with the patient or authorized representative who has indicated his/her understanding and acceptance.     Dental Advisory Given  Plan Discussed with: Anesthesiologist, CRNA and Surgeon  Anesthesia Plan Comments: (Patient consented for risks of anesthesia including but not limited to:  - adverse reactions to medications - damage to teeth, lips or other oral mucosa - sore throat or hoarseness - Damage to heart, brain, lungs or loss of life  Patient voiced understanding.)       Anesthesia Quick Evaluation

## 2019-03-31 NOTE — Anesthesia Procedure Notes (Signed)
Procedure Name: LMA Insertion Date/Time: 03/31/2019 12:19 PM Performed by: Manning Charity, CRNA Pre-anesthesia Checklist: Patient identified, Emergency Drugs available, Suction available, Patient being monitored and Timeout performed Patient Re-evaluated:Patient Re-evaluated prior to induction Oxygen Delivery Method: Circle system utilized Preoxygenation: Pre-oxygenation with 100% oxygen Induction Type: IV induction Ventilation: Mask ventilation without difficulty LMA: LMA inserted LMA Size: 4.0 Dental Injury: Teeth and Oropharynx as per pre-operative assessment

## 2019-03-31 NOTE — Discharge Instructions (Addendum)
Hysteroscopy, Care After °This sheet gives you information about how to care for yourself after your procedure. Your health care provider may also give you more specific instructions. If you have problems or questions, contact your health care provider. °What can I expect after the procedure? °After the procedure, it is common to have: °· Cramping. °· Bleeding. This can vary from light spotting to menstrual-like bleeding. °Follow these instructions at home: °Activity °· Rest for 1-2 days after the procedure. °· Do not douche, use tampons, or have sex for 2 weeks after the procedure, or until your health care provider approves. °· Do not drive for 24 hours after the procedure, or for as long as told by your health care provider. °· Do not drive, use heavy machinery, or drink alcohol while taking prescription pain medicines. °Medicines ° °· Take over-the-counter and prescription medicines only as told by your health care provider. °· Do not take aspirin during recovery. It can increase the risk of bleeding. °General instructions °· Do not take baths, swim, or use a hot tub until your health care provider approves. Take showers instead of baths for 2 weeks, or for as long as told by your health care provider. °· To prevent or treat constipation while you are taking prescription pain medicine, your health care provider may recommend that you: °? Drink enough fluid to keep your urine clear or pale yellow. °? Take over-the-counter or prescription medicines. °? Eat foods that are high in fiber, such as fresh fruits and vegetables, whole grains, and beans. °? Limit foods that are high in fat and processed sugars, such as fried and sweet foods. °· Keep all follow-up visits as told by your health care provider. This is important. °Contact a health care provider if: °· You feel dizzy or lightheaded. °· You feel nauseous. °· You have abnormal vaginal discharge. °· You have a rash. °· You have pain that does not get better with  medicine. °· You have chills. °Get help right away if: °· You have bleeding that is heavier than a normal menstrual period. °· You have a fever. °· You have pain or cramps that get worse. °· You develop new abdominal pain. °· You faint. °· You have pain in your shoulders. °· You have shortness of breath. °Summary °· After the procedure, you may have cramping and some vaginal bleeding. °· Do not douche, use tampons, or have sex for 2 weeks after the procedure, or until your health care provider approves. °· Do not take baths, swim, or use a hot tub until your health care provider approves. Take showers instead of baths for 2 weeks, or for as long as told by your health care provider. °· Report any unusual symptoms to your health care provider. °· Keep all follow-up visits as told by your health care provider. This is important. °This information is not intended to replace advice given to you by your health care provider. Make sure you discuss any questions you have with your health care provider. °Document Released: 08/05/2013 Document Revised: 11/13/2016 Document Reviewed: 11/13/2016 °Elsevier Interactive Patient Education © 2019 [employer] ° ° °AMBULATORY SURGERY  °DISCHARGE INSTRUCTIONS ° ° °1) The drugs that you were given will stay in your system until tomorrow so for the next 24 hours you should not: ° °A) Drive an automobile °B) Make any legal decisions °C) Drink any alcoholic beverage ° ° °2) You may resume regular meals tomorrow.  Today it is better to start with liquids and   gradually work up to solid foods. ° °You may eat anything you prefer, but it is better to start with liquids, then soup and crackers, and gradually work up to solid foods. ° ° °3) Please notify your doctor immediately if you have any unusual bleeding, trouble breathing, redness and pain at the surgery site, drainage, fever, or pain not relieved by medication. ° ° ° °4) Additional Instructions: ° ° ° ° ° ° ° °Please contact your  physician with any problems or Same Day Surgery at [phone], Monday through Friday 6 am to 4 pm, or [facility] [place] Main number at [phone]. °

## 2019-03-31 NOTE — Transfer of Care (Signed)
Immediate Anesthesia Transfer of Care Note  Patient: Tonya Castro  Procedure(s) Performed: DILATATION AND CURETTAGE /HYSTEROSCOPY POLYPECTOMY (N/A )  Patient Location: PACU  Anesthesia Type:General  Level of Consciousness: awake, alert  and oriented  Airway & Oxygen Therapy: Patient Spontanous Breathing and Patient connected to face mask oxygen  Post-op Assessment: Report given to RN and Post -op Vital signs reviewed and stable  Post vital signs: Reviewed and stable  Last Vitals:  Vitals Value Taken Time  BP    Temp    Pulse    Resp    SpO2      Last Pain:  Vitals:   03/31/19 0932  TempSrc: Oral  PainSc: 0-No pain         Complications: No apparent anesthesia complications

## 2019-03-31 NOTE — Anesthesia Postprocedure Evaluation (Signed)
Anesthesia Post Note  Patient: Anova Quirke  Procedure(s) Performed: DILATATION AND CURETTAGE /HYSTEROSCOPY POLYPECTOMY (N/A )  Patient location during evaluation: PACU Anesthesia Type: General Level of consciousness: awake and alert Pain management: pain level controlled Vital Signs Assessment: post-procedure vital signs reviewed and stable Respiratory status: spontaneous breathing, nonlabored ventilation, respiratory function stable and patient connected to nasal cannula oxygen Cardiovascular status: blood pressure returned to baseline and stable Postop Assessment: no apparent nausea or vomiting Anesthetic complications: no     Last Vitals:  Vitals:   03/31/19 1317 03/31/19 1330  BP: (!) 116/58 140/79  Pulse:  79  Resp:  14  Temp: 36.9 C 36.7 C  SpO2: 98% 100%    Last Pain:  Vitals:   03/31/19 1330  TempSrc: Tympanic  PainSc:                  Cleda Mccreedy Piscitello

## 2019-03-31 NOTE — Op Note (Signed)
Operative Note  03/31/2019  PRE-OP DIAGNOSIS: Metrorrhagia, Endometrial polyp  POST-OP DIAGNOSIS: same   SURGEON: Annamarie Major, MD, FACOG  PROCEDURE: Procedure(s): DILATATION AND CURETTAGE /HYSTEROSCOPY POLYPECTOMY   ANESTHESIA: Choice   ESTIMATED BLOOD LOSS: Min   SPECIMENS:  ECC, Endometrial polyp  FLUID DEFICIT: Min  COMPLICATIONS: None  DISPOSITION: PACU - hemodynamically stable.  CONDITION: stable  FINDINGS: Exam under anesthesia revealed small, mobile  uterus with no masses and bilateral adnexa without masses or fullness. Hysteroscopy revealed a  grossly normal appearing uterine cavity WITH small fundal based polyp and with bilateral tubal ostia and normal appearing endocervical canal.  PROCEDURE IN DETAIL: After informed consent was obtained, the patient was taken to the operating room where anesthesia was obtained without difficulty. The patient was positioned in the dorsal lithotomy position in [place] stirrups. The patient's bladder was catheterized with an in and out foley catheter. The patient was examined under anesthesia, with the above noted findings. The weightedspeculum was placed inside the patient's vagina, and the the anterior lip of the cervix was seen and grasped with the tenaculum.  An Endocervical specimen was obtained with a kevorkian curette. The uterine cavity was sounded to 8cm, and then the cervix was progressively dilated to a 16 French-Pratt dilator. The 30 degree hysteroscope was introduced, with saline fluid used to distend the intrauterine cavity, with the above noted findings.  The myosure device was utilized to extract the polyp. Excellent hemostasis was noted, and all instruments were removed, with excellent hemostasis noted throughout. She was then taken out of dorsal lithotomy. Minimal discrepancy in fluid was noted.  The patient tolerated the procedure well. Sponge, lap and needle counts were correct x2. The patient was taken to recovery room in  excellent condition.  Annamarie Major, MD, [facility], [facility] 03/31/2019  12:29 PM

## 2019-04-01 LAB — SURGICAL PATHOLOGY

## 2019-04-13 ENCOUNTER — Ambulatory Visit (INDEPENDENT_AMBULATORY_CARE_PROVIDER_SITE_OTHER): Payer: BC Managed Care – PPO | Admitting: Obstetrics & Gynecology

## 2019-04-13 ENCOUNTER — Encounter: Payer: Self-pay | Admitting: Obstetrics & Gynecology

## 2019-04-13 ENCOUNTER — Other Ambulatory Visit: Payer: Self-pay

## 2019-04-13 VITALS — Ht 65.0 in | Wt 230.0 lb

## 2019-04-13 DIAGNOSIS — N921 Excessive and frequent menstruation with irregular cycle: Secondary | ICD-10-CM

## 2019-04-13 DIAGNOSIS — N840 Polyp of corpus uteri: Secondary | ICD-10-CM

## 2019-04-13 NOTE — Progress Notes (Signed)
Virtual Visit via Telephone Note  I connected with Tonya Castro on 04/13/19 at  3:50 PM EDT by telephone and verified that I am speaking with the correct person using two identifiers.   I discussed the limitations, risks, security and privacy concerns of performing an evaluation and management service by telephone and the availability of in person appointments. I also discussed with the patient that there may be a patient responsible charge related to this service. The patient expressed understanding and agreed to proceed.  She was at home and I was in my office.  History of Present Illness: Postoperative Follow-up Patient presents post op from operative hysteroscopy for polyp, metrorrhagia, 2 weeks ago.  Subjective: Patient reports marked improvement in her preop symptoms. Eating a regular diet without difficulty. The patient is not having any pain.  Activity: normal activities of daily living. Patient reports additional symptom's since surgery of None; not had period as of yet.  Observations/Objective: No exam today, due to telephone eVisit due to [facility] virus restriction on elective visits and procedures.  Prior visits reviewed along with ultrasounds/labs as indicated.  Assessment and Plan: 1. Endometrial polyp Removed s/p hysteroscopy  2. Metrorrhagia Monitor future cycles  Follow Up Instructions: As needed Monitor bleeding   I discussed the assessment and treatment plan with the patient. The patient was provided an opportunity to ask questions and all were answered. The patient agreed with the plan and demonstrated an understanding of the instructions.   The patient was advised to call back or seek an in-person evaluation if the symptoms worsen or if the condition fails to improve as anticipated.  I provided 5 minutes of non-face-to-face time during this encounter.   Hoyt Koch, MD

## 2019-12-26 ENCOUNTER — Other Ambulatory Visit: Payer: Self-pay | Admitting: Obstetrics and Gynecology

## 2019-12-26 DIAGNOSIS — I10 Essential (primary) hypertension: Secondary | ICD-10-CM

## 2019-12-29 ENCOUNTER — Encounter: Payer: Self-pay | Admitting: Obstetrics and Gynecology

## 2019-12-29 DIAGNOSIS — I10 Essential (primary) hypertension: Secondary | ICD-10-CM

## 2019-12-29 DIAGNOSIS — Z1231 Encounter for screening mammogram for malignant neoplasm of breast: Secondary | ICD-10-CM

## 2019-12-29 MED ORDER — LISINOPRIL 10 MG PO TABS: 10.0000 mg | ORAL_TABLET | Freq: Every day | ORAL | 0 refills | Status: DC

## 2020-01-20 ENCOUNTER — Other Ambulatory Visit: Payer: Self-pay

## 2020-01-20 ENCOUNTER — Ambulatory Visit: Payer: BC Managed Care – PPO | Admitting: Obstetrics and Gynecology

## 2020-01-20 ENCOUNTER — Encounter: Payer: Self-pay | Admitting: Obstetrics and Gynecology

## 2020-01-20 ENCOUNTER — Ambulatory Visit
Admission: RE | Admit: 2020-01-20 | Discharge: 2020-01-20 | Disposition: A | Discharge status: Home / Self Care | Payer: BC Managed Care – PPO | Source: Ambulatory Visit | Attending: Obstetrics and Gynecology | Admitting: Obstetrics and Gynecology

## 2020-01-20 DIAGNOSIS — Z1231 Encounter for screening mammogram for malignant neoplasm of breast: Secondary | ICD-10-CM | POA: Diagnosis present

## 2020-03-17 NOTE — Progress Notes (Signed)
PCP:  [name], MD   Chief Complaint  Patient presents with  . Gynecologic Exam     HPI:      [name] is a 44 y.o. [id number] who LMP was Patient's last menstrual period was 02/29/2020 (exact date)., presents today for her annual examination.  Her menses are regular every 28-30 days, lasting 4-5 days again. Dysmenorrhea mod, occurring first 1-2 days of flow. No longer has BTB and flow is improved and shorter. S/p hysteroscopy for polyp, metrorrhagia, 5/20 with Dr. Kenton Kingfisher  Sex activity: not sex active; s/p tubal ligation.  Last Pap: 12/16/18  Results were:  ASCUS  /neg HPV DNA ; repeat pap due today Hx of STDs: none  Last mammo: 01/20/20 Results: normal, repeat in 12 months. There is no FH of breast cancer. There is no FH of ovarian cancer. The patient does not do self-breast exams. She has sub-q scarring of LT breast at 10:00 position after horse bite. Pt had neg eval with bx in the past. No change in area per pt report.   Tobacco use: The patient denies current or previous tobacco use. Alcohol use: none No drug use.  Exercise: mod active  She does get adequate calcium and Vitamin D in her diet.  She takes lisinopril 10 mg daily for HTN with sx control. BPs are controlled. No side effects. Pt due for labs/Rx RF.  She had elevated lipids 2/20. Suggested diet/exericse/wt loss changes, add fish oil. Due for repeat labs.  She has a hx of anxiety and was taking effexor and buspar, stopped this yr due to concern about wt gain. Pt unhappy with wt. Is trying diet changes (no cal counting) and has started exercise without much change. Anxiety is increased however. Pt also under increased stress with work and finishing her master's degree. Will have time this summer to relax more. Not sure if she wants to restart anxiety meds. Has seen psych off and on for many yrs. Not sleeping well and taking trazodone with sleep improvement. Needs Rx RF. Would like RF alprazolam to take  sparingly.    Past Medical History:  Diagnosis Date  . Anxiety   . Essential hypertension 09/24/2017  . External hemorrhoid   . History of heavy vaginal bleeding   . Obesity (BMI 35.0-39.9 without comorbidity)   . PONV (postoperative nausea and vomiting)     Past Surgical History:  Procedure Laterality Date  . BREAST BIOPSY Left    x2- neg / clips  . CESAREAN SECTION    . CYSTOSCOPY N/A 03/14/2015   Procedure: CYSTOSCOPY;  Surgeon: Will Bonnet, MD;  Location: [facility];  Service: Gynecology;  Laterality: N/A;  . HYSTEROSCOPY N/A 03/14/2015   Procedure: HYSTEROSCOPY;  Surgeon: Will Bonnet, MD;  Location: [facility];  Service: Gynecology;  Laterality: N/A;  . HYSTEROSCOPY WITH D & C N/A 03/31/2019   Procedure: DILATATION AND CURETTAGE /HYSTEROSCOPY POLYPECTOMY;  Surgeon: [name], MD;  Location: [facility];  Service: Gynecology;  Laterality: N/A;  . IUD REMOVAL N/A 03/14/2015   Procedure: INTRAUTERINE DEVICE (IUD) REMOVAL;  Surgeon: Will Bonnet, MD;  Location: [facility];  Service: Gynecology;  Laterality: N/A;  . LAPAROSCOPY N/A 03/14/2015   Procedure: LAPAROSCOPY DIAGNOSTIC;  Surgeon: Will Bonnet, MD;  Location: [facility];  Service: Gynecology;  Laterality: N/A;  . MM BREAST STEREO BIOPSY LEFT ([place] HX)     [id number]  . TUBAL LIGATION Bilateral 03/14/2015   Procedure: BILATERAL TUBAL LIGATION;  Surgeon: Conard Novak, MD;  Location: [facility];  Service: Gynecology;  Laterality: Bilateral;    Family History  Problem Relation Age of Onset  . Skin cancer Paternal Grandfather 75  . Skin cancer Paternal Uncle 40  . Breast cancer Neg Hx     Social History   Socioeconomic History  . Marital status: Divorced    Spouse name: Not on file  . Number of children: Not on file  . Years of education: Not on file  . Highest education level: Not on file  Occupational History  . Not on file  Tobacco Use  . Smoking status: Never Smoker  . Smokeless tobacco: Never  Used  Substance and Sexual Activity  . Alcohol use: No  . Drug use: No  . Sexual activity: Not Currently    Birth control/protection: Surgical    Comment: Tubal ligation  Other Topics Concern  . Not on file  Social History Narrative  . Not on file   Social Determinants of Health   Financial Resource Strain:   . Difficulty of Paying Living Expenses:   Food Insecurity:   . Worried About Programme researcher, broadcasting/film/video in the Last Year:   . Barista in the Last Year:   Transportation Needs:   . Freight forwarder (Medical):   Marland Kitchen Lack of Transportation (Non-Medical):   Physical Activity:   . Days of Exercise per Week:   . Minutes of Exercise per Session:   Stress:   . Feeling of Stress :   Social Connections:   . Frequency of Communication with Friends and Family:   . Frequency of Social Gatherings with Friends and Family:   . Attends Religious Services:   . Active Member of Clubs or Organizations:   . Attends Banker Meetings:   Marland Kitchen Marital Status:   Intimate Partner Violence:   . Fear of Current or Ex-Partner:   . Emotionally Abused:   Marland Kitchen Physically Abused:   . Sexually Abused:     Current Meds  Medication Sig  . ibuprofen (ADVIL) 200 MG tablet Take 600 mg by mouth every 6 (six) hours as needed for headache or moderate pain.  Marland Kitchen VITAMIN D PO Take 1 tablet by mouth daily.  . [DISCONTINUED] lisinopril (ZESTRIL) 10 MG tablet Take 1 tablet (10 mg total) by mouth daily.  Marland Kitchen lisinopril (ZESTRIL) 10 MG tablet Take 1 tablet (10 mg total) by mouth daily.     ROS:  Review of Systems  Constitutional: Negative for appetite change, fatigue, fever and unexpected weight change.  Respiratory: Negative for cough, shortness of breath and wheezing.   Cardiovascular: Negative for chest pain, palpitations and leg swelling.  Gastrointestinal: Negative for blood in stool, constipation, diarrhea, nausea and vomiting.  Endocrine: Negative for cold intolerance, heat intolerance and  polyuria.  Genitourinary: Negative for dyspareunia, dysuria, flank pain, frequency, genital sores, hematuria, menstrual problem, pelvic pain, urgency, vaginal bleeding, vaginal discharge and vaginal pain.  Musculoskeletal: Negative for back pain, joint swelling and myalgias.  Skin: Negative for rash.  Neurological: Negative for dizziness, syncope, light-headedness, numbness and headaches.  Hematological: Negative for adenopathy.  Psychiatric/Behavioral: Positive for agitation. Negative for confusion, sleep disturbance and suicidal ideas. The patient is not nervous/anxious.   BREAST: mass/tenderness LT breast horse bite scar  Objective: BP 114/70   Ht 5\' 5"  (1.651 m)   Wt 223 lb (101.2 kg)   LMP 02/29/2020 (Exact Date)   BMI 37.11 kg/m    Physical Exam  Constitutional:      Appearance: She is well-developed.  Genitourinary:     Vulva, vagina, uterus, right adnexa and left adnexa normal.     No vulval lesion or tenderness noted.     No vaginal discharge, erythema or tenderness.     No cervical motion tenderness or polyp.     Uterus is not enlarged or tender.     No right or left adnexal mass present.     Right adnexa not tender.     Left adnexa not tender.  Rectum:     External hemorrhoid present.  Neck:     Thyroid: No thyromegaly.  Cardiovascular:     Rate and Rhythm: Normal rate and regular rhythm.     Heart sounds: Normal heart sounds. No murmur.  Pulmonary:     Effort: Pulmonary effort is normal.     Breath sounds: Normal breath sounds.  Chest:     Breasts:        Right: No mass, nipple discharge, skin change or tenderness.        Left: Mass present. No nipple discharge, skin change or tenderness.    Abdominal:     Palpations: Abdomen is soft.     Tenderness: There is no abdominal tenderness. There is no guarding.  Musculoskeletal:        General: Normal range of motion.     Cervical back: Normal range of motion.  Neurological:     General: No focal deficit  present.     Mental Status: She is alert and oriented to person, place, and time.     Cranial Nerves: No cranial nerve deficit.  Skin:    General: Skin is warm and dry.  Psychiatric:        Mood and Affect: Mood normal.        Behavior: Behavior normal.        Thought Content: Thought content normal.        Judgment: Judgment normal.  Vitals reviewed.     Assessment/Plan: Encounter for annual routine gynecological examination  Cervical cancer screening - Plan: Cytology - PAP  ASCUS of cervix with negative high risk HPV - Plan: Cytology - PAP  Encounter for screening mammogram for malignant neoplasm of breast; pt current on mammo  Essential hypertension - Plan: Comprehensive metabolic panel, Lipid panel, Hemoglobin A1c, lisinopril (ZESTRIL) 10 MG tablet; Rx RF. Check CMP.   Blood tests for routine general physical examination - Plan: Comprehensive metabolic panel, Lipid panel, Hemoglobin A1c  Screening cholesterol level - Plan: Lipid panel  Mixed hyperlipidemia--recheck labs. Diet/exercise/wt loss changes.   Screening for diabetes mellitus - Plan: Hemoglobin A1c  Anxiety - Plan: traZODone (DESYREL) 50 MG tablet, ALPRAZolam (XANAX) 0.5 MG tablet; Rx trazodone for sleep and xanax prn. Pt will f/u if wants to restart effexor. Will have time this summer to relax more.   Weight loss counseling--MyFitness Pal app, 40 g carbs, cont exercise, stress mgmt. F/u prn.    Meds ordered this encounter  Medications  . lisinopril (ZESTRIL) 10 MG tablet    Sig: Take 1 tablet (10 mg total) by mouth daily.    Dispense:  90 tablet    Refill:  3    Order Specific Question:   Supervising Provider    Answer:   Nadara Mustard [id number]  . traZODone (DESYREL) 50 MG tablet    Sig: Take 1 tablet (50 mg total) by mouth at bedtime.    Dispense:  90 tablet  Refill:  1    Order Specific Question:   Supervising Provider    Answer:   Nadara Mustard [id number]  . ALPRAZolam (XANAX) 0.5 MG tablet     Sig: Take 1 tablet (0.5 mg total) by mouth 3 (three) times daily as needed for anxiety.    Dispense:  30 tablet    Refill:  0    Order Specific Question:   Supervising Provider    Answer:   Nadara Mustard [[id number]]             GYN counsel mammography screening, adequate intake of calcium and vitamin D, diet and exercise     F/U  Return in about 1 year (around 03/21/2021).  Esterlene Atiyeh B. Janellie Tennison, PA-C 03/21/2020 9:32 AM

## 2020-03-21 ENCOUNTER — Other Ambulatory Visit: Payer: Self-pay

## 2020-03-21 ENCOUNTER — Ambulatory Visit (INDEPENDENT_AMBULATORY_CARE_PROVIDER_SITE_OTHER): Payer: BC Managed Care – PPO | Admitting: Obstetrics and Gynecology

## 2020-03-21 ENCOUNTER — Other Ambulatory Visit (HOSPITAL_COMMUNITY)
Admission: RE | Admit: 2020-03-21 | Discharge: 2020-03-21 | Disposition: A | Discharge status: Home / Self Care | Payer: BC Managed Care – PPO | Source: Ambulatory Visit | Attending: Obstetrics and Gynecology | Admitting: Obstetrics and Gynecology

## 2020-03-21 ENCOUNTER — Encounter: Payer: Self-pay | Admitting: Obstetrics and Gynecology

## 2020-03-21 VITALS — BP 114/70 | Ht 65.0 in | Wt 223.0 lb

## 2020-03-21 DIAGNOSIS — Z124 Encounter for screening for malignant neoplasm of cervix: Secondary | ICD-10-CM

## 2020-03-21 DIAGNOSIS — I10 Essential (primary) hypertension: Secondary | ICD-10-CM

## 2020-03-21 DIAGNOSIS — Z13220 Encounter for screening for lipoid disorders: Secondary | ICD-10-CM

## 2020-03-21 DIAGNOSIS — R87610 Atypical squamous cells of undetermined significance on cytologic smear of cervix (ASC-US): Secondary | ICD-10-CM | POA: Diagnosis not present

## 2020-03-21 DIAGNOSIS — Z01419 Encounter for gynecological examination (general) (routine) without abnormal findings: Secondary | ICD-10-CM

## 2020-03-21 DIAGNOSIS — Z0000 Encounter for general adult medical examination without abnormal findings: Secondary | ICD-10-CM

## 2020-03-21 DIAGNOSIS — F419 Anxiety disorder, unspecified: Secondary | ICD-10-CM | POA: Insufficient documentation

## 2020-03-21 DIAGNOSIS — Z1231 Encounter for screening mammogram for malignant neoplasm of breast: Secondary | ICD-10-CM | POA: Diagnosis not present

## 2020-03-21 DIAGNOSIS — E782 Mixed hyperlipidemia: Secondary | ICD-10-CM

## 2020-03-21 DIAGNOSIS — Z131 Encounter for screening for diabetes mellitus: Secondary | ICD-10-CM

## 2020-03-21 MED ORDER — TRAZODONE HCL 50 MG PO TABS: 50.0000 mg | ORAL_TABLET | Freq: Every day | ORAL | 1 refills | Status: DC

## 2020-03-21 MED ORDER — LISINOPRIL 10 MG PO TABS: 10.0000 mg | ORAL_TABLET | Freq: Every day | ORAL | 3 refills | Status: DC

## 2020-03-21 MED ORDER — ALPRAZOLAM 0.5 MG PO TABS: 0.5000 mg | ORAL_TABLET | Freq: Three times a day (TID) | ORAL | 0 refills | Status: AC | PRN

## 2020-03-21 NOTE — Patient Instructions (Signed)
I value your feedback and entrusting us with your care. If you get a [facility] patient survey, I would appreciate you taking the time to let us know about your experience today. Thank you!  As of October 08, 2019, your lab results will be released to your MyChart immediately, before I even have a chance to see them. Please give me time to review them and contact you if there are any abnormalities. Thank you for your patience.  

## 2020-03-22 LAB — COMPREHENSIVE METABOLIC PANEL
ALT: 14 IU/L (ref 0–32)
AST: 14 IU/L (ref 0–40)
Albumin/Globulin Ratio: 1.8 (ref 1.2–2.2)
Albumin: 4.8 g/dL (ref 3.8–4.8)
Alkaline Phosphatase: 75 IU/L (ref 48–121)
BUN/Creatinine Ratio: 15 (ref 9–23)
BUN: 12 mg/dL (ref 6–24)
Bilirubin Total: 0.3 mg/dL (ref 0.0–1.2)
CO2: 19 mmol/L — ABNORMAL LOW (ref 20–29)
Calcium: 9.9 mg/dL (ref 8.7–10.2)
Chloride: 104 mmol/L (ref 96–106)
Creatinine, Ser: 0.79 mg/dL (ref 0.57–1.00)
GFR calc Af Amer: 106 mL/min/{1.73_m2} (ref >59)
GFR calc non Af Amer: 92 mL/min/{1.73_m2} (ref >59)
Globulin, Total: 2.6 g/dL (ref 1.5–4.5)
Glucose: 89 mg/dL (ref 65–99)
Potassium: 4.5 mmol/L (ref 3.5–5.2)
Sodium: 137 mmol/L (ref 134–144)
Total Protein: 7.4 g/dL (ref 6.0–8.5)

## 2020-03-22 LAB — LIPID PANEL
Chol/HDL Ratio: 5.3 ratio — ABNORMAL HIGH (ref 0.0–4.4)
Cholesterol, Total: 201 mg/dL — ABNORMAL HIGH (ref 100–199)
HDL: 38 mg/dL — ABNORMAL LOW (ref >39)
LDL Chol Calc (NIH): 124 mg/dL — ABNORMAL HIGH (ref 0–99)
Triglycerides: 221 mg/dL — ABNORMAL HIGH (ref 0–149)
VLDL Cholesterol Cal: 39 mg/dL (ref 5–40)

## 2020-03-22 LAB — HEMOGLOBIN A1C
Est. average glucose Bld gHb Est-mCnc: 108 mg/dL
Hgb A1c MFr Bld: 5.4 % (ref 4.8–5.6)

## 2020-03-23 LAB — CYTOLOGY - PAP
Adequacy: ABSENT
Diagnosis: NEGATIVE

## 2020-09-02 ENCOUNTER — Encounter: Payer: Self-pay | Admitting: Obstetrics and Gynecology

## 2020-09-02 DIAGNOSIS — K649 Unspecified hemorrhoids: Secondary | ICD-10-CM

## 2020-09-12 NOTE — Addendum Note (Signed)
Addended by: Althea Grimmer B on: 09/12/2020 09:08 AM   Modules accepted: Orders

## 2020-09-12 NOTE — Telephone Encounter (Signed)
Pt wanted ref change. Pls see GI info.

## 2020-09-17 ENCOUNTER — Other Ambulatory Visit: Payer: Self-pay | Admitting: Obstetrics and Gynecology

## 2020-09-17 DIAGNOSIS — F419 Anxiety disorder, unspecified: Secondary | ICD-10-CM

## 2020-12-15 ENCOUNTER — Other Ambulatory Visit: Payer: Self-pay | Admitting: Obstetrics and Gynecology

## 2020-12-15 DIAGNOSIS — F419 Anxiety disorder, unspecified: Secondary | ICD-10-CM

## 2021-01-18 ENCOUNTER — Other Ambulatory Visit: Payer: Self-pay | Admitting: Obstetrics and Gynecology

## 2021-01-18 DIAGNOSIS — Z1231 Encounter for screening mammogram for malignant neoplasm of breast: Secondary | ICD-10-CM

## 2021-02-14 ENCOUNTER — Other Ambulatory Visit: Payer: Self-pay | Admitting: Obstetrics and Gynecology

## 2021-02-14 ENCOUNTER — Encounter: Payer: Self-pay | Admitting: Obstetrics and Gynecology

## 2021-02-14 DIAGNOSIS — K649 Unspecified hemorrhoids: Secondary | ICD-10-CM

## 2021-02-14 MED ORDER — HYDROCORT-PRAMOXINE (PERIANAL) 2.5-1 % EX CREA: TOPICAL_CREAM | Freq: Two times a day (BID) | CUTANEOUS | 0 refills | Status: AC | PRN

## 2021-02-14 NOTE — Progress Notes (Signed)
Rx RF analpram for hemorrhoids.

## 2021-02-15 NOTE — Telephone Encounter (Signed)
When submitting PA, I got a message at the end sayin "contact pharmacy and ask them to call the Pharmacy Help Line at [phone]. Called pt's pharmacy and number given, they will call me once they have contacted help line.

## 2021-02-15 NOTE — Telephone Encounter (Signed)
PA for cream was faxed to [place] by pharmacy. Pt aware approval/denial takes a few business days. Advised using good rx coupon since it shows online around $38 at [facility] (which is her pharmacy already). Pt will use coupon for now since she's in a lot of pain. I will submit PA in meantime.

## 2021-02-16 ENCOUNTER — Ambulatory Visit
Admission: RE | Admit: 2021-02-16 | Discharge: 2021-02-16 | Disposition: A | Discharge status: Home / Self Care | Payer: BC Managed Care – PPO | Source: Ambulatory Visit | Attending: Obstetrics and Gynecology | Admitting: Obstetrics and Gynecology

## 2021-02-16 ENCOUNTER — Other Ambulatory Visit: Payer: Self-pay

## 2021-02-16 DIAGNOSIS — Z1231 Encounter for screening mammogram for malignant neoplasm of breast: Secondary | ICD-10-CM | POA: Diagnosis present

## 2021-02-21 NOTE — Telephone Encounter (Addendum)
Received PA fax from pharmacy, pt used goodrx coupon. Do not need PA. Called pharmacy to make them aware so they can stop PA faxes, spoke to someone in pharmacy but then was placed on hold for 21 mins waiting on pharmacist. All PA's can be shredded.

## 2021-02-27 ENCOUNTER — Encounter: Payer: Self-pay | Admitting: Obstetrics and Gynecology

## 2021-02-28 ENCOUNTER — Other Ambulatory Visit: Payer: Self-pay | Admitting: Obstetrics and Gynecology

## 2021-02-28 MED ORDER — NIRMATRELVIR/RITONAVIR (PAXLOVID)TABLET: ORAL_TABLET | ORAL | 0 refills | Status: DC

## 2021-02-28 NOTE — Progress Notes (Signed)
Rx paxlovid for positive covid with sx. Start today.

## 2021-03-22 DIAGNOSIS — F5101 Primary insomnia: Secondary | ICD-10-CM | POA: Insufficient documentation

## 2021-03-22 DIAGNOSIS — E782 Mixed hyperlipidemia: Secondary | ICD-10-CM | POA: Insufficient documentation

## 2021-03-22 NOTE — Progress Notes (Signed)
PCP:  Tonya Mustard, MD   Chief Complaint  Patient presents with  . Gynecologic Exam     HPI:      [name] is a 45 y.o. [id number] who LMP was Patient's last menstrual period was 03/12/2021., presents today for her annual examination.  Her menses are regular every 28-30 days, lasting 4-5 days. Dysmenorrhea mild to mod, occurring first 1-2 days of flow. No longer has BTB and flow is improved and shorter. S/p hysteroscopy for polyp, metrorrhagia, 5/20 with Dr. Tiburcio Pea  Sex activity: currently sex active; s/p tubal ligation. No pain/dryness Last Pap: 03/19/23 Results were negative; hx of ASCUS  /neg HPV DNA 2020  Hx of STDs: none  Last mammo: 02/16/21 Results: normal, repeat in 12 months. There is no FH of breast cancer. There is no FH of ovarian cancer. The patient does self-breast exams. She has sub-q scarring of LT breast at 10:00 position after horse bite. Pt had neg eval with bx in the past. No change in area per pt report.   Tobacco use: The patient denies current or previous tobacco use. Alcohol use: none No drug use.  Exercise: mod active  She does get adequate calcium and Vitamin D in her diet.  She takes lisinopril 10 mg daily for HTN with sx control. BPs are controlled. No side effects. Pt due for labs/Rx RF.  She had elevated lipids 5/21, improved from 2/20 labs. Suggested diet/exericse/wt loss changes, add fish oil. Due for repeat labs.  Hx of anxiety, much improved now. Had stopped daily meds last yr and took alprazolam sparingly. Not even taking that anymore. Doing well.  Has seen psych off and on for many yrs. Occas not sleeping well and takes trazodone prn with sleep improvement. Doesn't need RF currently.   Hx of hemorrhoid tags, saw GI who recommended increasing bulky stool with increased fiber intake.    Past Medical History:  Diagnosis Date  . Anxiety   . Essential hypertension 09/24/2017  . External hemorrhoid   . History of heavy vaginal  bleeding   . Obesity (BMI 35.0-39.9 without comorbidity)   . PONV (postoperative nausea and vomiting)     Past Surgical History:  Procedure Laterality Date  . BREAST BIOPSY Left    x2- neg / clips  . CESAREAN SECTION    . CYSTOSCOPY N/A 03/14/2015   Procedure: CYSTOSCOPY;  Surgeon: Conard Novak, MD;  Location: [facility];  Service: Gynecology;  Laterality: N/A;  . HYSTEROSCOPY N/A 03/14/2015   Procedure: HYSTEROSCOPY;  Surgeon: Conard Novak, MD;  Location: [facility];  Service: Gynecology;  Laterality: N/A;  . HYSTEROSCOPY WITH D & C N/A 03/31/2019   Procedure: DILATATION AND CURETTAGE /HYSTEROSCOPY POLYPECTOMY;  Surgeon: Tonya Mustard, MD;  Location: [facility];  Service: Gynecology;  Laterality: N/A;  . IUD REMOVAL N/A 03/14/2015   Procedure: INTRAUTERINE DEVICE (IUD) REMOVAL;  Surgeon: Conard Novak, MD;  Location: [facility];  Service: Gynecology;  Laterality: N/A;  . LAPAROSCOPY N/A 03/14/2015   Procedure: LAPAROSCOPY DIAGNOSTIC;  Surgeon: Conard Novak, MD;  Location: [facility];  Service: Gynecology;  Laterality: N/A;  . MM BREAST STEREO BIOPSY LEFT ([facility] HX)     [id number]  . TUBAL LIGATION Bilateral 03/14/2015   Procedure: BILATERAL TUBAL LIGATION;  Surgeon: Conard Novak, MD;  Location: [facility];  Service: Gynecology;  Laterality: Bilateral;    Family History  Problem Relation Age of Onset  . Skin cancer Paternal Grandfather 49  .  Skin cancer Paternal Uncle 40  . Breast cancer Neg Hx     Social History   Socioeconomic History  . Marital status: Divorced    Spouse name: Not on file  . Number of children: Not on file  . Years of education: Not on file  . Highest education level: Not on file  Occupational History  . Not on file  Tobacco Use  . Smoking status: Never Smoker  . Smokeless tobacco: Never Used  Vaping Use  . Vaping Use: Never used  Substance and Sexual Activity  . Alcohol use: No  . Drug use: No  . Sexual activity: Not Currently    Birth  control/protection: Surgical    Comment: Tubal ligation  Other Topics Concern  . Not on file  Social History Narrative  . Not on file   Social Determinants of Health   Financial Resource Strain: Not on file  Food Insecurity: Not on file  Transportation Needs: Not on file  Physical Activity: Not on file  Stress: Not on file  Social Connections: Not on file  Intimate Partner Violence: Not on file    Current Meds  Medication Sig  . ALPRAZolam (XANAX) 0.5 MG tablet Take 1 tablet (0.5 mg total) by mouth 3 (three) times daily as needed for anxiety.  . traZODone (DESYREL) 50 MG tablet TAKE 1 TABLET(50 MG) BY MOUTH AT BEDTIME  . [DISCONTINUED] lisinopril (ZESTRIL) 10 MG tablet Take 1 tablet (10 mg total) by mouth daily.     ROS:  Review of Systems  Constitutional: Negative for appetite change, fatigue, fever and unexpected weight change.  Respiratory: Negative for cough, shortness of breath and wheezing.   Cardiovascular: Negative for chest pain, palpitations and leg swelling.  Gastrointestinal: Positive for constipation. Negative for blood in stool, diarrhea, nausea and vomiting.  Endocrine: Negative for cold intolerance, heat intolerance and polyuria.  Genitourinary: Negative for dyspareunia, dysuria, flank pain, frequency, genital sores, hematuria, menstrual problem, pelvic pain, urgency, vaginal bleeding, vaginal discharge and vaginal pain.  Musculoskeletal: Negative for back pain, joint swelling and myalgias.  Skin: Negative for rash.  Neurological: Negative for dizziness, syncope, light-headedness, numbness and headaches.  Hematological: Negative for adenopathy.  Psychiatric/Behavioral: Positive for agitation. Negative for confusion, sleep disturbance and suicidal ideas. The patient is not nervous/anxious.   BREAST: mass/tenderness LT breast horse bite scar  Objective: BP 124/62   Pulse (!) 55   Temp 98.7 F (37.1 C)   Resp 16   Ht 5\' 5"  (1.651 m)   Wt 194 lb (88 kg)    LMP 03/12/2021   SpO2 98%   BMI 32.28 kg/m    Physical Exam Constitutional:      Appearance: She is well-developed.  Genitourinary:     Vulva normal.     Right Labia: No rash, tenderness or lesions.    Left Labia: No tenderness, lesions or rash.    No vaginal discharge, erythema or tenderness.      Right Adnexa: not tender and no mass present.    Left Adnexa: not tender and no mass present.    No cervical motion tenderness, friability or polyp.     Uterus is not enlarged or tender.  Rectum:     External hemorrhoid present.  Breasts:     Right: No mass, nipple discharge, skin change or tenderness.     Left: Mass present. No nipple discharge, skin change or tenderness.    Neck:     Thyroid: No thyromegaly.  Cardiovascular:  Rate and Rhythm: Normal rate and regular rhythm.     Heart sounds: Normal heart sounds. No murmur heard.   Pulmonary:     Effort: Pulmonary effort is normal.     Breath sounds: Normal breath sounds.  Chest:    Abdominal:     Palpations: Abdomen is soft.     Tenderness: There is no abdominal tenderness. There is no guarding or rebound.  Musculoskeletal:        General: Normal range of motion.     Cervical back: Normal range of motion.  Lymphadenopathy:     Cervical: No cervical adenopathy.  Neurological:     General: No focal deficit present.     Mental Status: She is alert and oriented to person, place, and time.     Cranial Nerves: No cranial nerve deficit.  Skin:    General: Skin is warm and dry.  Psychiatric:        Mood and Affect: Mood normal.        Behavior: Behavior normal.        Thought Content: Thought content normal.        Judgment: Judgment normal.  Vitals reviewed.     Assessment/Plan: Encounter for annual routine gynecological examination  Encounter for screening mammogram for malignant neoplasm of breast; pt current on mammo  Essential hypertension - Plan: Comprehensive metabolic panel, Comprehensive metabolic  panel, lisinopril (ZESTRIL) 10 MG tablet; controlled with lisinopril. Rx RF. Check CMP.  Mixed hyperlipidemia - Plan: Lipid panel, Lipid panel; recheck today. Will f/u with results.   Anxiety--much improved. F/u prn.   Primary insomnia--takes trazodone prn. Will call for RF prn.   Blood tests for routine general physical examination - Plan: Comprehensive metabolic panel, Lipid panel, Lipid panel, Comprehensive metabolic panel    Meds ordered this encounter  Medications  . lisinopril (ZESTRIL) 10 MG tablet    Sig: Take 1 tablet (10 mg total) by mouth daily.    Dispense:  90 tablet    Refill:  3    Order Specific Question:   Supervising Provider    Answer:   Tonya Castro [[id number]]             GYN counsel adequate intake of calcium and vitamin D, diet and exercise     F/U  Return in about 1 year (around 03/23/2022).  Veronique Warga B. Deirdre Gryder, PA-C 03/23/2021 9:00 AM

## 2021-03-23 ENCOUNTER — Encounter: Payer: Self-pay | Admitting: Obstetrics and Gynecology

## 2021-03-23 ENCOUNTER — Other Ambulatory Visit: Payer: Self-pay

## 2021-03-23 ENCOUNTER — Ambulatory Visit (INDEPENDENT_AMBULATORY_CARE_PROVIDER_SITE_OTHER): Payer: BC Managed Care – PPO | Admitting: Obstetrics and Gynecology

## 2021-03-23 VITALS — BP 124/62 | HR 55 | Temp 98.7°F | Resp 16 | Ht 65.0 in | Wt 194.0 lb

## 2021-03-23 DIAGNOSIS — E782 Mixed hyperlipidemia: Secondary | ICD-10-CM

## 2021-03-23 DIAGNOSIS — I10 Essential (primary) hypertension: Secondary | ICD-10-CM

## 2021-03-23 DIAGNOSIS — F419 Anxiety disorder, unspecified: Secondary | ICD-10-CM

## 2021-03-23 DIAGNOSIS — F5101 Primary insomnia: Secondary | ICD-10-CM

## 2021-03-23 DIAGNOSIS — Z0000 Encounter for general adult medical examination without abnormal findings: Secondary | ICD-10-CM

## 2021-03-23 DIAGNOSIS — Z1231 Encounter for screening mammogram for malignant neoplasm of breast: Secondary | ICD-10-CM | POA: Diagnosis not present

## 2021-03-23 DIAGNOSIS — Z01419 Encounter for gynecological examination (general) (routine) without abnormal findings: Secondary | ICD-10-CM | POA: Diagnosis not present

## 2021-03-23 MED ORDER — LISINOPRIL 10 MG PO TABS: 10.0000 mg | ORAL_TABLET | Freq: Every day | ORAL | 3 refills | Status: DC

## 2021-03-23 NOTE — Patient Instructions (Signed)
I value your feedback and you entrusting us with your care. If you get a [facility] patient survey, I would appreciate you taking the time to let us know about your experience today. Thank you! ? ? ?

## 2021-03-24 LAB — COMPREHENSIVE METABOLIC PANEL
ALT: 12 IU/L (ref 0–32)
AST: 13 IU/L (ref 0–40)
Albumin/Globulin Ratio: 1.5 (ref 1.2–2.2)
Albumin: 4.3 g/dL (ref 3.8–4.8)
Alkaline Phosphatase: 70 IU/L (ref 44–121)
BUN/Creatinine Ratio: 17 (ref 9–23)
BUN: 13 mg/dL (ref 6–24)
Bilirubin Total: 0.4 mg/dL (ref 0.0–1.2)
CO2: 22 mmol/L (ref 20–29)
Calcium: 9.5 mg/dL (ref 8.7–10.2)
Chloride: 103 mmol/L (ref 96–106)
Creatinine, Ser: 0.75 mg/dL (ref 0.57–1.00)
Globulin, Total: 2.9 g/dL (ref 1.5–4.5)
Glucose: 92 mg/dL (ref 65–99)
Potassium: 4.5 mmol/L (ref 3.5–5.2)
Sodium: 139 mmol/L (ref 134–144)
Total Protein: 7.2 g/dL (ref 6.0–8.5)
eGFR: 101 mL/min/{1.73_m2} (ref >59)

## 2021-03-24 LAB — LIPID PANEL
Chol/HDL Ratio: 5 ratio — ABNORMAL HIGH (ref 0.0–4.4)
Cholesterol, Total: 225 mg/dL — ABNORMAL HIGH (ref 100–199)
HDL: 45 mg/dL (ref >39)
LDL Chol Calc (NIH): 153 mg/dL — ABNORMAL HIGH (ref 0–99)
Triglycerides: 147 mg/dL (ref 0–149)
VLDL Cholesterol Cal: 27 mg/dL (ref 5–40)

## 2021-05-02 ENCOUNTER — Other Ambulatory Visit: Payer: Self-pay | Admitting: Obstetrics and Gynecology

## 2021-05-02 ENCOUNTER — Encounter: Payer: Self-pay | Admitting: Obstetrics and Gynecology

## 2021-05-02 DIAGNOSIS — F419 Anxiety disorder, unspecified: Secondary | ICD-10-CM

## 2021-05-02 MED ORDER — TRAZODONE HCL 50 MG PO TABS: ORAL_TABLET | ORAL | 1 refills | Status: DC

## 2021-05-02 NOTE — Progress Notes (Signed)
Rx RF trazodone prn insomnia. Under stress currently.

## 2021-10-19 IMAGING — MG MM DIGITAL SCREENING BILAT W/ TOMO AND CAD
8 series · 8 of 24 positions shown · non-contrast
Comparison: Previous exam(s).

CLINICAL DATA: Screening.

EXAM:
DIGITAL SCREENING BILATERAL MAMMOGRAM WITH TOMOSYNTHESIS AND CAD
TECHNIQUE: Bilateral screening digital craniocaudal and mediolateral oblique
mammograms were obtained. Bilateral screening digital breast
tomosynthesis was performed. The images were evaluated with
computer-aided detection.

[L MLO synth-2D]
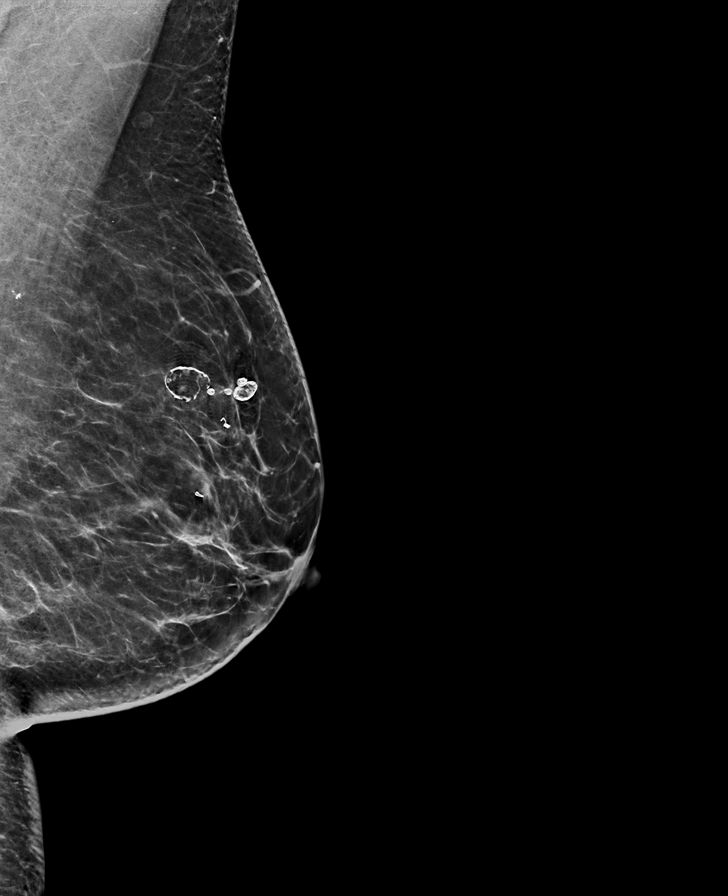

[R CC synth-2D]
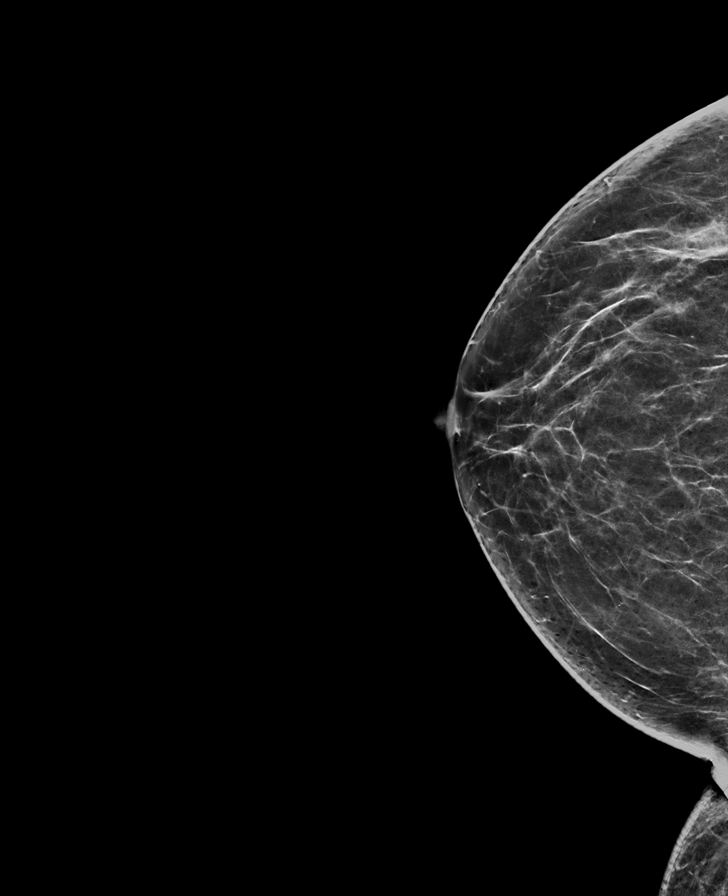

[R MLO synth-2D]
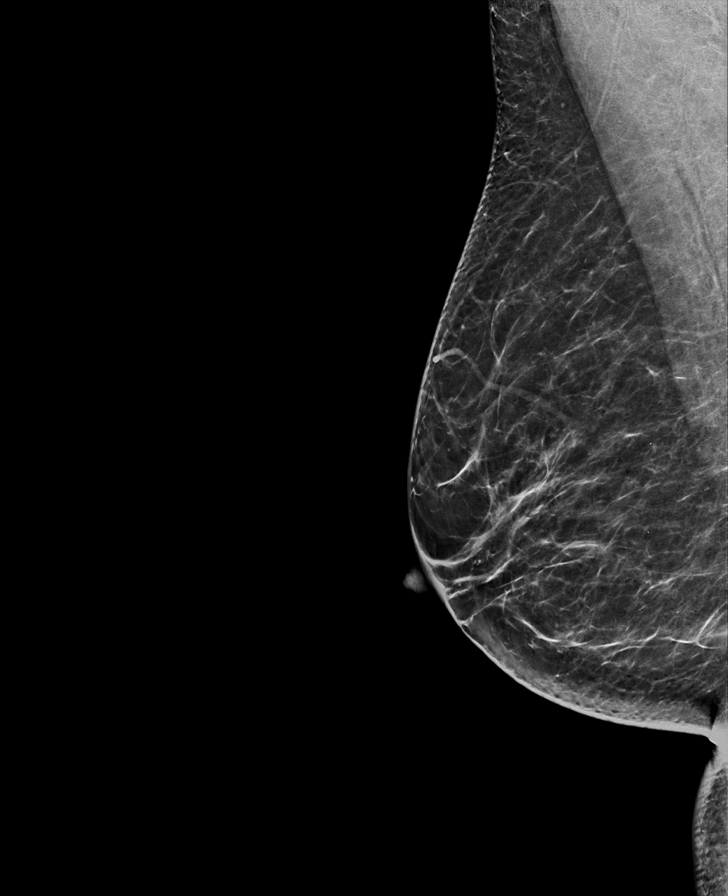

[L CC synth-2D]
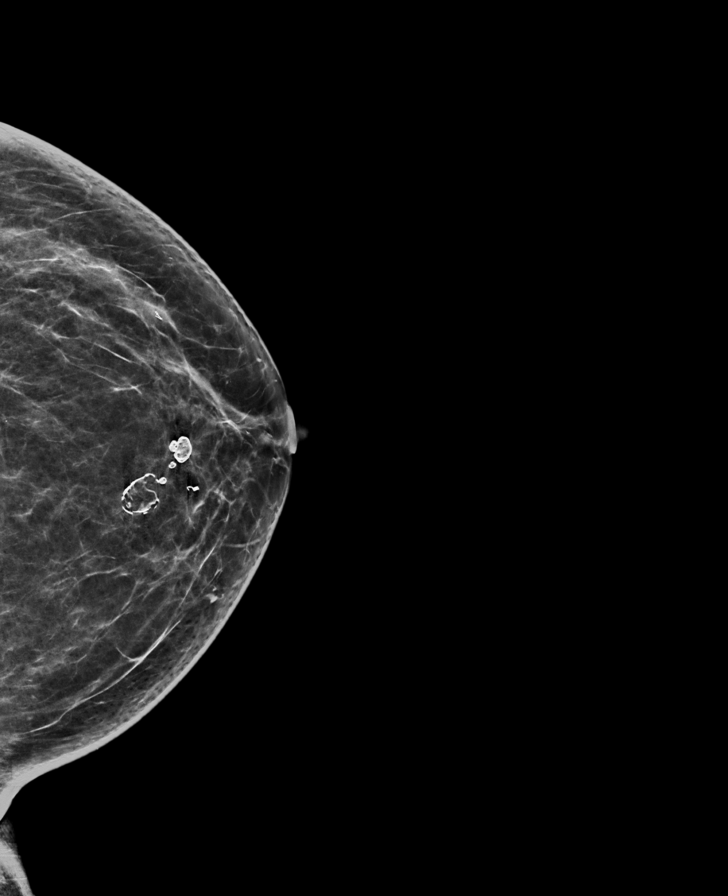

[R MLO tomo · tomo slice 35/70.0]
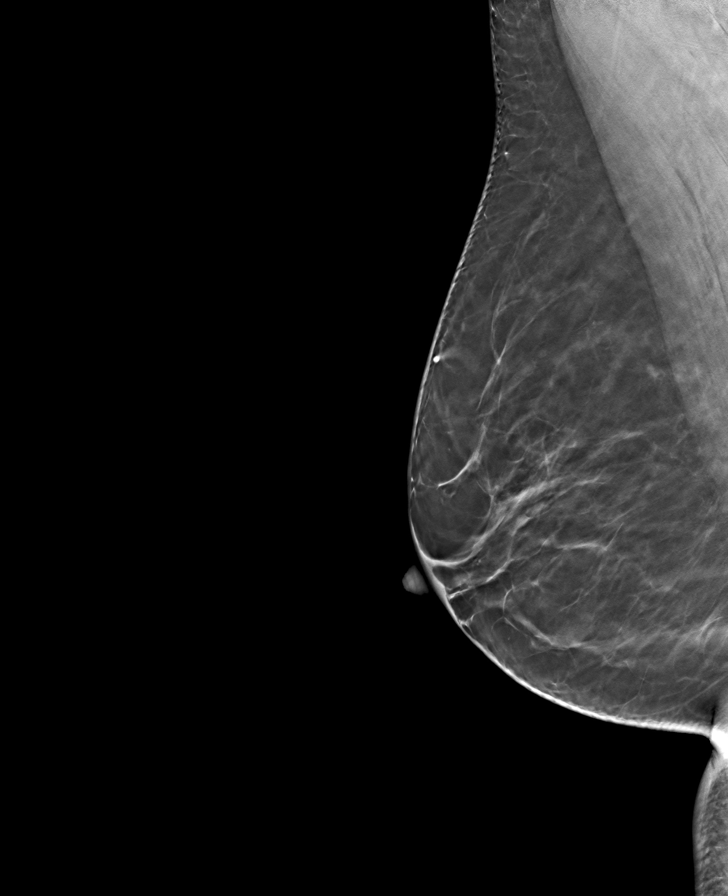

[R CC tomo · tomo slice 33/66.0]
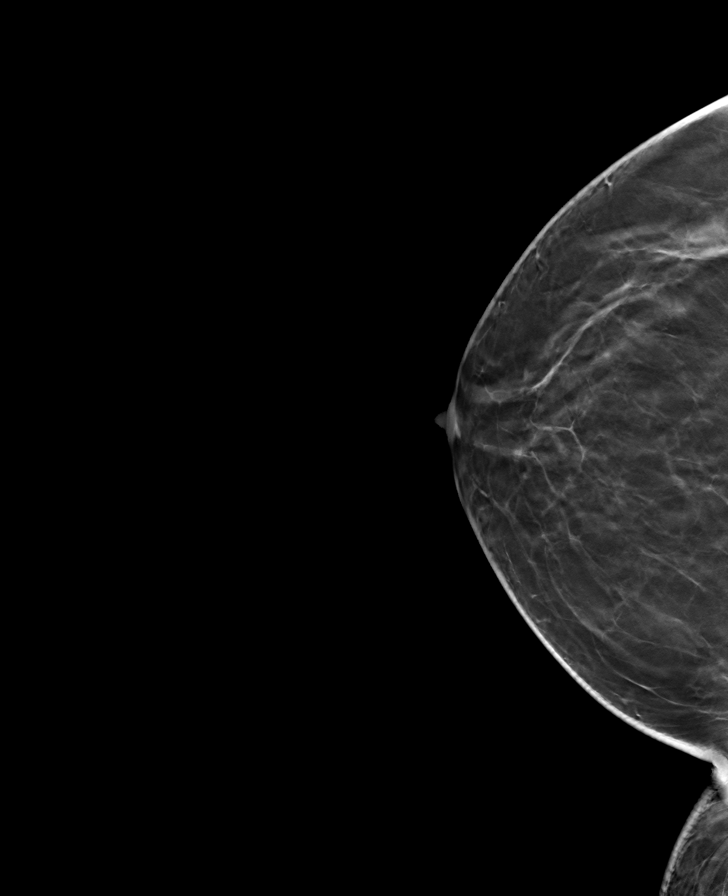

[L CC tomo · tomo slice 33/66.0]
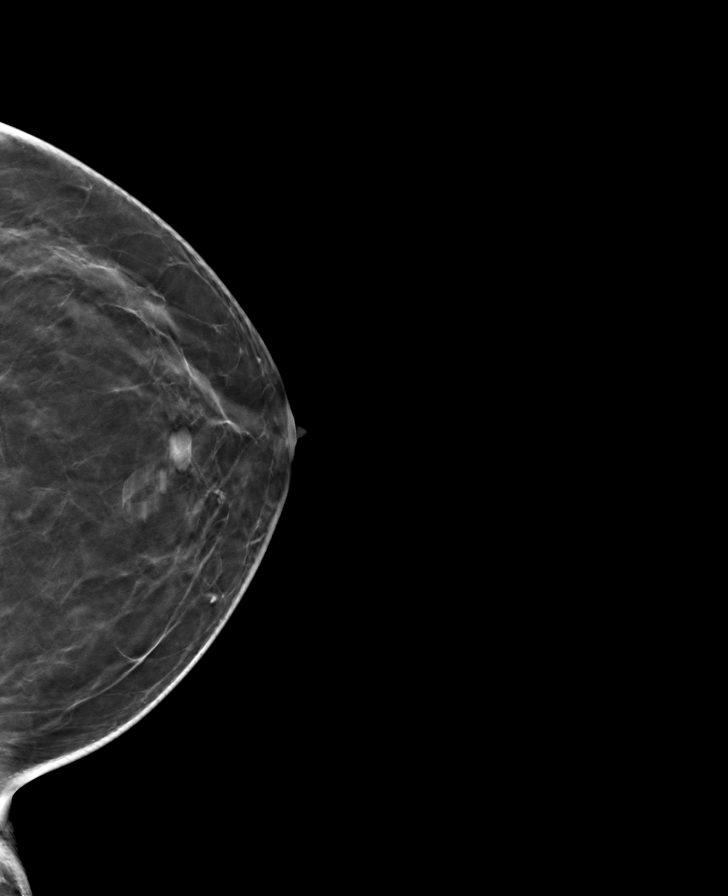

[L MLO tomo · tomo slice 37/72.0]
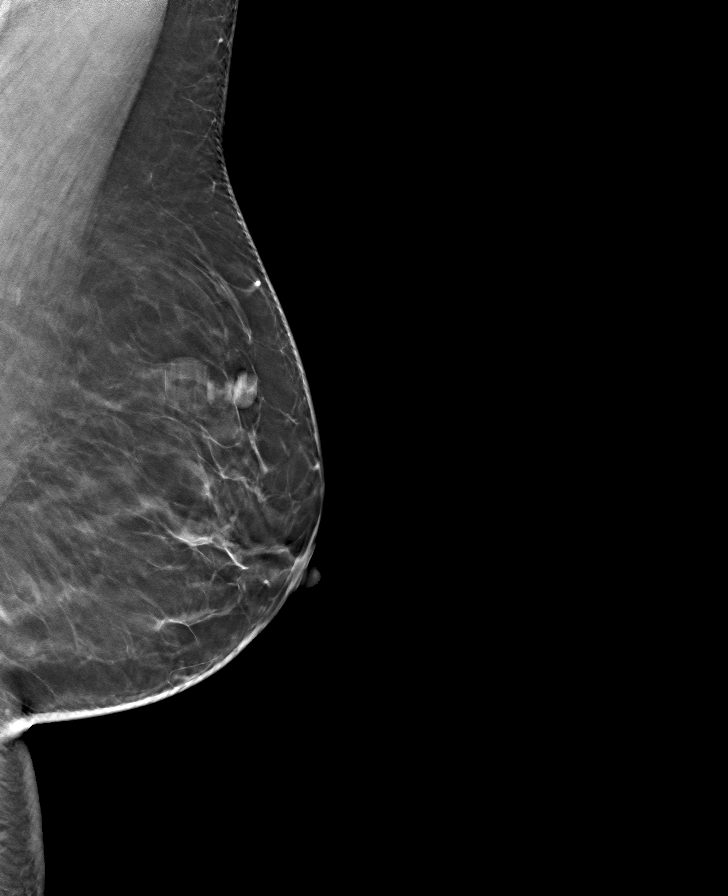

[8 of 24 positions shown; findings below may reference images not displayed]

ACR Breast Density Category b: There are scattered areas of
fibroglandular density.
FINDINGS: There are no findings suspicious for malignancy. The images were
evaluated with computer-aided detection.
IMPRESSION: No mammographic evidence of malignancy. A result letter of this
screening mammogram will be mailed directly to the patient.

RECOMMENDATION:
Screening mammogram in one year. (Code:WJ-I-BG6)

BI-RADS CATEGORY  1: Negative.

## 2021-10-25 ENCOUNTER — Other Ambulatory Visit: Payer: Self-pay | Admitting: Obstetrics and Gynecology

## 2021-10-25 DIAGNOSIS — F419 Anxiety disorder, unspecified: Secondary | ICD-10-CM

## 2021-12-28 ENCOUNTER — Encounter: Payer: Self-pay | Admitting: Obstetrics and Gynecology

## 2021-12-28 ENCOUNTER — Ambulatory Visit: Payer: BC Managed Care – PPO | Admitting: Obstetrics and Gynecology

## 2021-12-28 ENCOUNTER — Other Ambulatory Visit: Payer: Self-pay

## 2021-12-28 ENCOUNTER — Telehealth: Payer: Self-pay | Admitting: Obstetrics and Gynecology

## 2021-12-28 VITALS — BP 134/86 | Ht 65.0 in | Wt 216.0 lb

## 2021-12-28 DIAGNOSIS — N939 Abnormal uterine and vaginal bleeding, unspecified: Secondary | ICD-10-CM

## 2021-12-28 DIAGNOSIS — N921 Excessive and frequent menstruation with irregular cycle: Secondary | ICD-10-CM | POA: Diagnosis not present

## 2021-12-28 NOTE — Telephone Encounter (Signed)
Pt needed an [place] in two weeks, but we did not have any openings on 3/16.  I offered 2/23 but she had class on that day.  I scheduled her on 3/30.  Is it ok for this [place] to take place on 3/30? ?

## 2021-12-28 NOTE — Patient Instructions (Signed)
I value your feedback and you entrusting us with your care. If you get a [facility] patient survey, I would appreciate you taking the time to let us know about your experience today. Thank you! ? ? ?

## 2021-12-28 NOTE — Progress Notes (Signed)
? ? ?Pcp, No ? ? ?Chief Complaint  ?Patient presents with  ? Vaginal Bleeding  ?  Last normal cycle Jan, started bleeding again 12/04/21 and has not stopped, heavy bleeding recently, abnormal pain  ? ? ?HPI: ?     [name] is a 46 y.o. [id number] whose LMP was Patient's last menstrual period was 12/04/2021 (exact date)., presents today for AUB since 2/23. Her menses are usually regular every 28-30 days, lasting 4-5 days. Dysmenorrhea mild to mod, occurring first 1-2 days of flow. Has had BTB past 3-4 cycles. Menses 11/13/21 was normal and on time, then had light bleeding for 5 days 12/04/21. Stopped for a few days and then started with heavy bleeding and clots on 12/18/21 with mod to severe dysmen, improved with ibup and heating pad; lasted about 3 days. Clots were large, soiling clothes, and changing products hourly. Couldn't go to work 1 day due to flow. Flow then lightened, stopped today, dysmen improved. S/p hysteroscopy for polyp, metrorrhagia, 5/20 with Dr. Tiburcio Pea; sx feel similar again.  ?Pt is sex active, no pain/bleeding. S/p TL. Neg pap 5/2, neg HPV DNA 2020.  ? ?Patient Active Problem List  ? Diagnosis Date Noted  ? Abnormal uterine bleeding (AUB) 12/28/2021  ? Mixed hyperlipidemia 03/22/2021  ? Primary insomnia 03/22/2021  ? Anxiety 03/21/2020  ? ASCUS of cervix with negative high risk HPV 03/21/2020  ? Endometrial polyp 03/31/2019  ? Metrorrhagia 01/13/2019  ? Endometrial thickening on ultrasound 01/13/2019  ? Essential hypertension 09/24/2017  ? Malpositioned intrauterine device 03/14/2015  ? Obesity (BMI 35.0-39.9 without comorbidity) 03/14/2015  ? IUD complication (HCC) 03/14/2015  ? Abdominal pain, acute 03/14/2015  ? Pelvic pain in female 03/14/2015  ? ? ?Past Surgical History:  ?Procedure Laterality Date  ? BREAST BIOPSY Left   ? x2- neg / clips  ? CESAREAN SECTION    ? CYSTOSCOPY N/A 03/14/2015  ? Procedure: CYSTOSCOPY;  Surgeon: Conard Novak, MD;  Location: [facility];  Service:  Gynecology;  Laterality: N/A;  ? HYSTEROSCOPY N/A 03/14/2015  ? Procedure: HYSTEROSCOPY;  Surgeon: Conard Novak, MD;  Location: [facility];  Service: Gynecology;  Laterality: N/A;  ? HYSTEROSCOPY WITH D & C N/A 03/31/2019  ? Procedure: DILATATION AND CURETTAGE /HYSTEROSCOPY POLYPECTOMY;  Surgeon: Nadara Mustard, MD;  Location: [facility];  Service: Gynecology;  Laterality: N/A;  ? IUD REMOVAL N/A 03/14/2015  ? Procedure: INTRAUTERINE DEVICE (IUD) REMOVAL;  Surgeon: Conard Novak, MD;  Location: [facility];  Service: Gynecology;  Laterality: N/A;  ? LAPAROSCOPY N/A 03/14/2015  ? Procedure: LAPAROSCOPY DIAGNOSTIC;  Surgeon: Conard Novak, MD;  Location: [facility];  Service: Gynecology;  Laterality: N/A;  ? MM BREAST STEREO BIOPSY LEFT ([facility] HX)    ? [id number]  ? TUBAL LIGATION Bilateral 03/14/2015  ? Procedure: BILATERAL TUBAL LIGATION;  Surgeon: Conard Novak, MD;  Location: [facility];  Service: Gynecology;  Laterality: Bilateral;  ? ? ?Family History  ?Problem Relation Age of Onset  ? Skin cancer Paternal Grandfather 13  ? Skin cancer Paternal Uncle 40  ? Breast cancer Neg Hx   ? ? ?Social History  ? ?Socioeconomic History  ? Marital status: Divorced  ?  Spouse name: Not on file  ? Number of children: Not on file  ? Years of education: Not on file  ? Highest education level: Not on file  ?Occupational History  ? Not on file  ?Tobacco Use  ? Smoking status: Never  ? Smokeless  tobacco: Never  ?Vaping Use  ? Vaping Use: Never used  ?Substance and Sexual Activity  ? Alcohol use: No  ? Drug use: No  ? Sexual activity: Yes  ?  Birth control/protection: Surgical  ?  Comment: Tubal ligation  ?Other Topics Concern  ? Not on file  ?Social History Narrative  ? Not on file  ? ?Social Determinants of Health  ? ?Financial Resource Strain: Not on file  ?Food Insecurity: Not on file  ?Transportation Needs: Not on file  ?Physical Activity: Not on file  ?Stress: Not on file  ?Social Connections: Not on file  ?Intimate Partner  Violence: Not on file  ? ? ?Outpatient Medications Prior to Visit  ?Medication Sig Dispense Refill  ? ALPRAZolam (XANAX) 0.5 MG tablet Take 1 tablet (0.5 mg total) by mouth 3 (three) times daily as needed for anxiety. 30 tablet 0  ? ibuprofen (ADVIL) 200 MG tablet Take 600 mg by mouth every 6 (six) hours as needed for headache or moderate pain.    ? lisinopril (ZESTRIL) 10 MG tablet Take 1 tablet (10 mg total) by mouth daily. 90 tablet 3  ? traZODone (DESYREL) 50 MG tablet TAKE 1 TABLET(50 MG) BY MOUTH AT BEDTIME 90 tablet 1  ? VITAMIN D PO Take 1 tablet by mouth daily. (Patient not taking: Reported on 12/28/2021)    ? nirmatrelvir/ritonavir EUA (PAXLOVID) TABS Take 1 dose BID for 5 days (Patient not taking: Reported on 03/23/2021) 15 tablet 0  ? ?No facility-administered medications prior to visit.  ? ? ? ? ?ROS: ? ?Review of Systems  ?Constitutional:  Negative for fever.  ?Gastrointestinal:  Negative for blood in stool, constipation, diarrhea, nausea and vomiting.  ?Genitourinary:  Positive for menstrual problem. Negative for dyspareunia, dysuria, flank pain, frequency, hematuria, urgency, vaginal bleeding, vaginal discharge and vaginal pain.  ?Musculoskeletal:  Negative for back pain.  ?Skin:  Negative for rash.  ?BREAST: No symptoms ? ? ?OBJECTIVE:  ? ?Vitals:  ?BP 134/86   Ht 5\' 5"  (1.651 m)   Wt 216 lb (98 kg)   LMP 12/04/2021 (Exact Date)   BMI 35.94 kg/m?  ? ?Physical Exam ?Vitals reviewed.  ?Constitutional:   ?   Appearance: She is well-developed.  ?Pulmonary:  ?   Effort: Pulmonary effort is normal.  ?Genitourinary: ?   General: Normal vulva.  ?   Pubic Area: No rash.   ?   Labia:     ?   Right: No rash, tenderness or lesion.     ?   Left: No rash, tenderness or lesion.   ?   Vagina: Bleeding present. No vaginal discharge, erythema or tenderness.  ?   Cervix: Normal.  ?   Uterus: Normal. Not enlarged and not tender.   ?   Adnexa: Right adnexa normal and left adnexa normal.    ?   Right: No mass or  tenderness.      ?   Left: No mass or tenderness.    ?Musculoskeletal:     ?   General: Normal range of motion.  ?   Cervical back: Normal range of motion.  ?Skin: ?   General: Skin is warm and dry.  ?Neurological:  ?   General: No focal deficit present.  ?   Mental Status: She is alert and oriented to person, place, and time.  ?Psychiatric:     ?   Mood and Affect: Mood normal.     ?   Behavior: Behavior normal.     ?  Thought Content: Thought content normal.     ?   Judgment: Judgment normal.  ? ? ?Assessment/Plan: ?Abnormal uterine bleeding (AUB) - Plan: US PELVIS TRANSVAGINAL NON-OB (TV ONLY); hx of uterine polyp in 2020. Neg exam except small amt of bleeding. Check GYN u/s and will f/u with results. If heavy bleeding recurs, will Rx aygestin and check u/s sooner. F/u prn.  ? ?Menorrhagia with irregular cycle - Plan: US PELVIS TRANSVAGINAL NON-OB (TV ONLY) ? ? ? Return in about 2 weeks (around 01/11/2022) for GYN u/s for AUB/menorrhagia--[facility] to call pt. ? ?Skye Rodarte B. Ivana Nicastro, PA-C ?12/28/2021 ?4:06 PM ? ? ? ? ? ?

## 2021-12-28 NOTE — Telephone Encounter (Signed)
That's fine. We'll do urgent u/s if needed sooner. Thx ? ?

## 2022-01-25 ENCOUNTER — Ambulatory Visit: Payer: BC Managed Care – PPO

## 2022-01-25 DIAGNOSIS — N921 Excessive and frequent menstruation with irregular cycle: Secondary | ICD-10-CM

## 2022-01-25 DIAGNOSIS — N939 Abnormal uterine and vaginal bleeding, unspecified: Secondary | ICD-10-CM

## 2022-02-15 ENCOUNTER — Other Ambulatory Visit: Payer: Self-pay | Admitting: Obstetrics and Gynecology

## 2022-02-15 ENCOUNTER — Ambulatory Visit (INDEPENDENT_AMBULATORY_CARE_PROVIDER_SITE_OTHER): Payer: BC Managed Care – PPO

## 2022-02-15 DIAGNOSIS — N921 Excessive and frequent menstruation with irregular cycle: Secondary | ICD-10-CM

## 2022-02-15 DIAGNOSIS — N939 Abnormal uterine and vaginal bleeding, unspecified: Secondary | ICD-10-CM

## 2022-02-21 ENCOUNTER — Encounter: Payer: Self-pay | Admitting: Obstetrics and Gynecology

## 2022-02-21 DIAGNOSIS — N921 Excessive and frequent menstruation with irregular cycle: Secondary | ICD-10-CM

## 2022-02-21 MED ORDER — NORETHINDRONE ACETATE 5 MG PO TABS: 5.0000 mg | ORAL_TABLET | Freq: Every day | ORAL | 0 refills | Status: DC

## 2022-02-21 NOTE — Telephone Encounter (Signed)
Pt aware of GYN u/s results. EM=17 mm but similar for pt in 2021 when she had endometrial polyp. Pt had AUB 1/23 but menses now more like 25, 37, 41 days. Bleeding is longer and heavier than used to be but no BTB. Flow has been heavy to the point of missing work, also with clots. Pt hasn't tried hormones. Hx of HTN, had lost IUD in past that had to be surgically removed so doesn't want another IUD. S/P TL. Had emotional sx with OCPs in adolescence. ? ?Given menses not too frequent, discussed prog only options vs hyst/D&C vs ablation. Pt will try hormones for now. Annual due 5/23. Rx aygestin 5 mg daily, f/u at annual. If has issues, will then refer to MD for surg/mgmt.  ?

## 2022-03-06 ENCOUNTER — Other Ambulatory Visit: Payer: Self-pay | Admitting: Obstetrics and Gynecology

## 2022-03-06 DIAGNOSIS — I10 Essential (primary) hypertension: Secondary | ICD-10-CM

## 2022-03-15 ENCOUNTER — Telehealth: Payer: BC Managed Care – PPO | Admitting: Physician Assistant

## 2022-03-15 DIAGNOSIS — N921 Excessive and frequent menstruation with irregular cycle: Secondary | ICD-10-CM | POA: Diagnosis not present

## 2022-03-15 DIAGNOSIS — N946 Dysmenorrhea, unspecified: Secondary | ICD-10-CM | POA: Diagnosis not present

## 2022-03-15 MED ORDER — IBUPROFEN 800 MG PO TABS: 800.0000 mg | ORAL_TABLET | Freq: Three times a day (TID) | ORAL | 0 refills | Status: AC | PRN

## 2022-03-15 MED ORDER — METHOCARBAMOL 500 MG PO TABS: 500.0000 mg | ORAL_TABLET | Freq: Four times a day (QID) | ORAL | 0 refills | Status: DC

## 2022-03-15 NOTE — Patient Instructions (Addendum)
Gwinda Passe, thank you for joining Margaretann Loveless, PA-C for today's virtual visit.  While this provider is not your primary care provider (PCP), if your PCP is located in our provider database this encounter information will be shared with them immediately following your visit.  Consent: (Patient) Tonya Castro provided verbal consent for this virtual visit at the beginning of the encounter.  Current Medications:  Current Outpatient Medications:    ibuprofen (ADVIL) 800 MG tablet, Take 1 tablet (800 mg total) by mouth every 8 (eight) hours as needed., Disp: 30 tablet, Rfl: 0   methocarbamol (ROBAXIN) 500 MG tablet, Take 1 tablet (500 mg total) by mouth 4 (four) times daily., Disp: 30 tablet, Rfl: 0   ALPRAZolam (XANAX) 0.5 MG tablet, Take 1 tablet (0.5 mg total) by mouth 3 (three) times daily as needed for anxiety., Disp: 30 tablet, Rfl: 0   lisinopril (ZESTRIL) 10 MG tablet, Take 1 tablet (10 mg total) by mouth daily., Disp: 90 tablet, Rfl: 3   norethindrone (AYGESTIN) 5 MG tablet, Take 1 tablet (5 mg total) by mouth daily., Disp: 90 tablet, Rfl: 0   traZODone (DESYREL) 50 MG tablet, TAKE 1 TABLET(50 MG) BY MOUTH AT BEDTIME, Disp: 90 tablet, Rfl: 1   VITAMIN D PO, Take 1 tablet by mouth daily. (Patient not taking: Reported on 12/28/2021), Disp: , Rfl:    Medications ordered in this encounter:  Meds ordered this encounter  Medications   ibuprofen (ADVIL) 800 MG tablet    Sig: Take 1 tablet (800 mg total) by mouth every 8 (eight) hours as needed.    Dispense:  30 tablet    Refill:  0    Order Specific Question:   Supervising Provider    Answer:   Hyacinth Meeker, BRIAN [[id number]]   methocarbamol (ROBAXIN) 500 MG tablet    Sig: Take 1 tablet (500 mg total) by mouth 4 (four) times daily.    Dispense:  30 tablet    Refill:  0    Order Specific Question:   Supervising Provider    Answer:   Hyacinth Meeker, BRIAN [[id number]]     *If you need refills on other medications prior to your next appointment,  please contact your pharmacy*  Follow-Up: Call back or seek an in-person evaluation if the symptoms worsen or if the condition fails to improve as anticipated.  Other Instructions Magnesium GLYCINATE 200-400mg  daily for menstrual cramping  Dysmenorrhea Dysmenorrhea refers to cramps caused by the muscles of the uterus tightening (contracting) during a menstrual period. Dysmenorrhea may be mild, or it may be severe enough to interfere with everyday activities for a few days each month. Primary dysmenorrhea is menstrual cramps that last a couple of days when a female starts having menstrual periods or soon after. As a female gets older or has a baby, the cramps will usually lessen or disappear. Secondary dysmenorrhea begins later in life and is caused by a disorder in the reproductive system. It lasts longer, and it may cause more pain than primary dysmenorrhea. The pain may start before the period and last a few days after the period. What are the causes? Dysmenorrhea is usually caused by an underlying problem, such as: Endometriosis. The tissue that lines the uterus (endometrium) growing outside of the uterus in other areas of the body. Adenomyosis. Endometrial tissue growing into the muscular walls of the uterus. Pelvic congestive syndrome. Blood vessels in the pelvis that fill with blood just before the menstrual period. Overgrowth of cells (polyps) in  the endometrium or the lower part of the uterus (cervix). Uterine prolapse. The uterus dropping down into the vagina due to stretched or weak muscles. Bladder problems, such as infection or inflammation. Intestinal problems, such as a tumor or irritable bowel syndrome. Cancer of the reproductive organs or bladder. Other causes of this condition may result from: A severely tipped uterus. A cervix that is closed or has a small opening. Noncancerous (benign) tumors in the uterus (fibroids). Pelvic inflammatory disease (PID). Pelvic scarring  (adhesions) from a previous surgery. An ovarian cyst. An IUD (intrauterine device). What increases the risk? You are more likely to develop this condition if: You are younger than 45 years old. You started puberty early. You have irregular or heavy bleeding. You have never given birth. You have a family history of dysmenorrhea. You smoke or use nicotine products. You have high body weight or a low body weight. What are the signs or symptoms? Symptoms of this condition include: Cramping, throbbing pain in lower abdomen or lower back, or a feeling of fullness in the lower abdomen. Periods lasting for longer than 7 days. Headaches. Bloating. Fatigue. Nausea or vomiting. Diarrhea or loose stools. Sweating or dizziness. How is this diagnosed? This condition may be diagnosed based on: Your symptoms. Your medical history. A physical exam. Blood tests. A Pap test. This is a test in which cells from the cervix are tested for signs of cancer or infection. A pregnancy test. You may also have other tests, including: Imaging tests, such as: Ultrasound. A procedure to remove and examine a sample of endometrial tissue (dilation and curettage, D&C). A procedure to visually examine the inside of: The uterus (hysteroscopy). The abdomen or pelvis (laparoscopy). The bladder (cystoscopy). X-rays. CT scan. MRI. How is this treated? Treatment depends on the cause of the dysmenorrhea. Treatment may include medicines, such as: Pain medicines. Hormone replacement therapy. Injections of progesterone to stop the menstrual period. Birth control pills that contain the hormone progesterone. An IUD that contains the hormone progesterone. NSAIDs, such as ibuprofen. These may help to stop the production of hormones that cause cramps. Antidepressant medicines. Other treatment may include: Surgery to remove adhesions, endometriosis, ovarian cysts, fibroids, or the entire uterus  (hysterectomy). Endometrial ablation. This is a procedure to destroy the endometrium. Presacral neurectomy. This is a procedure to cut the nerves in the bottom of the spine (sacrum) that go to the reproductive organs. Sacral nerve stimulation. This is a procedure to apply an electric current to nerves in the sacrum. Exercise and physical therapy. Meditation, yoga, and acupuncture. Work with your health care provider to determine what treatment or combination of treatments is best for you. Follow these instructions at home: Relieving pain and cramping  If directed, apply heat to your lower back or abdomen when you experience pain or cramps. Use the heat source that your health care provider recommends, such as a moist heat pack or a heating pad. Place a towel between your skin and the heat source. Leave the heat on for 20-30 minutes. Remove the heat if your skin turns bright red. This is especially important if you are unable to feel pain, heat, or cold. You may have a greater risk of getting burned. Do not sleep with a heating pad on. Exercise. Activities such as walking, swimming, or biking can help to relieve cramps. Massage your lower back or abdomen to help relieve pain. General instructions Take over-the-counter and prescription medicines only as told by your health care provider. Ask your  health care provider if the medicine prescribed to you requires you to avoid driving or using machinery. Avoid alcohol and caffeine during and right before your period. These can make cramps worse. Do not use any products that contain nicotine or tobacco. These products include cigarettes, chewing tobacco, and vaping devices, such as e-cigarettes. If you need help quitting, ask your health care provider. Keep all follow-up visits. This is important. Contact a health care provider if: You have pain that gets worse or does not get better with medicine. You have pain with sex. You develop nausea or  vomiting with your period that is not controlled with medicine. Get help right away if: You faint. Summary Dysmenorrhea refers to cramps caused by the muscles of the uterus tightening (contracting) during a menstrual period. Dysmenorrhea may be mild, or it may be severe enough to interfere with everyday activities for a few days each month. Treatment depends on the cause of the dysmenorrhea. Work with your health care provider to determine what treatment or combination of treatments is best for you. This information is not intended to replace advice given to you by your health care provider. Make sure you discuss any questions you have with your health care provider. Document Revised: 06/01/2020 Document Reviewed: 06/01/2020 Elsevier Patient Education  2023 Else[employer] Inc.  Menorrhagia Menorrhagia is a form of abnormal uterine bleeding in which menstrual periods are heavy or last longer than normal. With menorrhagia, the periods may cause enough blood loss and cramping that a woman becomes unable to take part in her usual activities. What are the causes? Common causes of this condition include: Polyps or fibroids. These are noncancerous growths in the uterus. An imbalance of the hormones estrogen and progesterone. Anovulation, which occurs when one of the ovaries does not release an egg during one or more months. A problem with the thyroid gland (hypothyroidism). Side effects of having an intrauterine device (IUD). Side effects of some medicines, such as NSAIDs or blood thinners. A bleeding disorder that stops the blood from clotting normally. In some cases, the cause of this condition is not known. What increases the risk? You are more likely to develop this condition if you have cancer of the uterus. What are the signs or symptoms? Symptoms of this condition include: Routinely having to change your pad or tampon every 1-2 hours because it is soaked. Needing to use pads and tampons at  the same time because of heavy bleeding. Needing to wake up to change your pads or tampons during the night. Passing blood clots larger than 1 inch (2.5 cm) in size. Having bleeding that lasts for more than 7 days. Having symptoms of low iron levels (anemia), such as tiredness (fatigue) or shortness of breath. How is this diagnosed? This condition may be diagnosed based on: A physical exam. Your symptoms and menstrual history. Tests, such as: Blood tests to check if you are pregnant or if you have hormonal changes, a bleeding or thyroid disorder, anemia, or other problems. Pap test to check for cancerous changes, infections, or inflammation. Endometrial biopsy. This test involves removing a tissue sample from the lining of the uterus (endometrium) to be examined under a microscope. Pelvic ultrasound. This test uses sound waves to create images of your uterus, ovaries, and vagina. The images can show if you have fibroids or other growths. Hysteroscopy. For this test, a thin, flexible tube with a light on the end (hysteroscope) is used to look inside your uterus. How is this treated? Treatment  may not be needed for this condition. If it is needed, the best treatment for you will depend on: Whether you need to prevent pregnancy. Your desire to have children in the future. The cause and severity of your bleeding. Your personal preference. Medicine Medicines are the first step in treatment. You may be treated with: Hormonal birth control methods. These treatments reduce bleeding during your menstrual period. They include: Birth control pills. Skin patch. Vaginal ring. Shots (injections) that you get every 3 months. Hormonal IUD. Implants that go under the skin. Medicines that thicken the blood and slow bleeding. Medicines that reduce swelling, such as ibuprofen. Medicines that contain an artificial (synthetic) hormone called progestin. Medicines that make the ovaries stop working for a  short time. Iron supplements to treat anemia.  Surgery If medicines do not work, surgery may be done. Surgical options may include: Dilation and curettage (D&C). In this procedure, your health care provider opens the lowest part of the uterus (cervix) and then scrapes or suctions tissue from the endometrium. This reduces menstrual bleeding. Operative hysteroscopy. In this procedure, a hysteroscope is used to view your uterus and help remove polyps that may be causing heavy periods. Endometrial ablation. This is when various techniques are used to permanently destroy your entire endometrium. After endometrial ablation, most women have little or no menstrual flow. This procedure reduces your ability to become pregnant. Endometrial resection. In this procedure, an electrosurgical wire loop is used to remove the endometrium. This procedure reduces your ability to become pregnant. Hysterectomy. This is surgical removal of your uterus. This is a permanent procedure that stops menstrual periods. Pregnancy is not possible after a hysterectomy. Follow these instructions at home: Medicines Take over-the-counter and prescription medicines only as told by your health care provider. This includes iron pills. Do not change or switch medicines without asking your health care provider. Do not take aspirin or medicines that contain aspirin 1 week before or during your menstrual period. Aspirin may make bleeding worse. Managing constipation Your iron pills may cause constipation. If you are taking prescription iron supplements, you may need to take these actions to prevent or treat constipation: Drink enough fluid to keep your urine pale yellow. Take over-the-counter or prescription medicines. Eat foods that are high in fiber, such as beans, whole grains, and fresh fruits and vegetables. Limit foods that are high in fat and processed sugars, such as fried or sweet foods. General instructions If you need to  change your sanitary pad or tampon more than once every 2 hours, limit your activity until the bleeding stops. Eat well-balanced meals, including foods that are high in iron. Foods that have a lot of iron include leafy green vegetables, meat, liver, eggs, and whole-grain breads and cereals. Do not try to lose weight until the abnormal bleeding has stopped and your blood iron level is back to normal. If you need to lose weight, work with your health care provider to lose weight safely. Keep all follow-up visits. This is important. Contact a health care provider if: You soak through a pad or tampon every 1 or 2 hours, and this happens every time you have a period. You need to use pads and tampons at the same time because you are bleeding so much. You have nausea, vomiting, diarrhea, or other problems related to medicines you are taking. Get help right away if: You soak through more than a pad or tampon in 1 hour. You pass clots bigger than 1 inch (2.5 cm) wide.  You feel short of breath. You feel like your heart is beating too fast. You feel dizzy or you faint. You feel very weak or tired. Summary Menorrhagia is a form of abnormal uterine bleeding in which menstrual periods are heavy or last longer than normal. Treatment may not be needed for this condition. If it is needed, it may include medicines or procedures. Take over-the-counter and prescription medicines only as told by your health care provider. This includes iron pills. Get help right away if you have heavy bleeding that soaks through more than a pad or tampon in 1 hour, you pass large clots, or you feel dizzy, short of breath, or very weak or tired. This information is not intended to replace advice given to you by your health care provider. Make sure you discuss any questions you have with your health care provider. Document Revised: 06/28/2020 Document Reviewed: 06/28/2020 Elsevier Patient Education  2023 Elsevier Inc.     If  you have been instructed to have an in-person evaluation today at a local Urgent Care facility, please use the link below. It will take you to a list of all of our available [facility], including address, phone number and hours of operation. Please do not delay care.  [facility]  If you or a family member do not have a primary care provider, use the link below to schedule a visit and establish care. When you choose a [facility] primary care physician or advanced practice provider, you gain a long-term partner in health. Find a Primary Care Provider  Learn more about [facility] in-office and virtual care options: [facility] - Get Care Now

## 2022-03-15 NOTE — Progress Notes (Signed)
Virtual Visit Consent   [name], you are scheduled for a virtual visit with a  provider today. Just as with appointments in the office, your consent must be obtained to participate. Your consent will be active for this visit and any virtual visit you may have with one of our providers in the next 365 days. If you have a MyChart account, a copy of this consent can be sent to you electronically.  As this is a virtual visit, video technology does not allow for your provider to perform a traditional examination. This may limit your provider's ability to fully assess your condition. If your provider identifies any concerns that need to be evaluated in person or the need to arrange testing (such as labs, EKG, etc.), we will make arrangements to do so. Although advances in technology are sophisticated, we cannot ensure that it will always work on either your end or our end. If the connection with a video visit is poor, the visit may have to be switched to a telephone visit. With either a video or telephone visit, we are not always able to ensure that we have a secure connection.  By engaging in this virtual visit, you consent to the provision of healthcare and authorize for your insurance to be billed (if applicable) for the services provided during this visit. Depending on your insurance coverage, you may receive a charge related to this service.  I need to obtain your verbal consent now. Are you willing to proceed with your visit today? Glendine Swetz has provided verbal consent on 03/15/2022 for a virtual visit (video or telephone). Margaretann Loveless, PA-C  Date: 03/15/2022 7:55 PM  Virtual Visit via Video Note   I, Margaretann Loveless, connected with  Jennife Zaucha  ([id number], [birth year]) on 03/15/22 at  7:45 PM EDT by a video-enabled telemedicine application and verified that I am speaking with the correct person using two identifiers.  Location: Patient: Virtual Visit  Location Patient: Home Provider: Virtual Visit Location Provider: Home Office   I discussed the limitations of evaluation and management by telemedicine and the availability of in person appointments. The patient expressed understanding and agreed to proceed.    History of Present Illness: Tonya Castro is a 46 y.o. who identifies as a female who was assigned female at birth, and is being seen today for dysmenorrhea and menorrhagia.  She has been undergoing work up for this issue since 12/28/21. She has had an endometrial polyp in the past that caused similar issues. Recently had vaginal [place] that showed thickened endometrial lining to 41mm. She was recently started on Norethindrone acetate 5mg  daily. She reports that today is day 6 of her menstrual cycle. This is the first menstrual on the progesterone only pill. She reports her menstrual cycle is very heavy and very painful. She is having trouble with normal activities due to pain. She has tried Ibuprofen, tylenol, and heating pad with no relief.    Problems:  Patient Active Problem List   Diagnosis Date Noted   Abnormal uterine bleeding (AUB) 12/28/2021   Mixed hyperlipidemia 03/22/2021   Primary insomnia 03/22/2021   Anxiety 03/21/2020   ASCUS of cervix with negative high risk HPV 03/21/2020   Endometrial polyp 03/31/2019   Metrorrhagia 01/13/2019   Endometrial thickening on ultrasound 01/13/2019   Essential hypertension 09/24/2017   Malpositioned intrauterine device 03/14/2015   Obesity (BMI 35.0-39.9 without comorbidity) 03/14/2015   IUD complication (HCC) 03/14/2015   Abdominal pain,  acute 03/14/2015   Pelvic pain in female 03/14/2015    Allergies:  Allergies  Allergen Reactions   Amoxicillin Swelling    Did it involve swelling of the face/tongue/throat, SOB, or low BP? Yes Did it involve sudden or severe rash/hives, skin peeling, or any reaction on the inside of your mouth or nose? No Did you need to seek medical attention  at a hospital or doctor's office? No When did it last happen?      childhood allergy  If all above answers are "NO", may proceed with cephalosporin use.    Vancomycin     Red man syndrome   Medications:  Current Outpatient Medications:    ibuprofen (ADVIL) 800 MG tablet, Take 1 tablet (800 mg total) by mouth every 8 (eight) hours as needed., Disp: 30 tablet, Rfl: 0   methocarbamol (ROBAXIN) 500 MG tablet, Take 1 tablet (500 mg total) by mouth 4 (four) times daily., Disp: 30 tablet, Rfl: 0   ALPRAZolam (XANAX) 0.5 MG tablet, Take 1 tablet (0.5 mg total) by mouth 3 (three) times daily as needed for anxiety., Disp: 30 tablet, Rfl: 0   lisinopril (ZESTRIL) 10 MG tablet, Take 1 tablet (10 mg total) by mouth daily., Disp: 90 tablet, Rfl: 3   norethindrone (AYGESTIN) 5 MG tablet, Take 1 tablet (5 mg total) by mouth daily., Disp: 90 tablet, Rfl: 0   traZODone (DESYREL) 50 MG tablet, TAKE 1 TABLET(50 MG) BY MOUTH AT BEDTIME, Disp: 90 tablet, Rfl: 1   VITAMIN D PO, Take 1 tablet by mouth daily. (Patient not taking: Reported on 12/28/2021), Disp: , Rfl:   Observations/Objective: Patient is well-developed, well-nourished in no acute distress.  Resting comfortably at home.  Head is normocephalic, atraumatic.  No labored breathing.  Speech is clear and coherent with logical content.  Patient is alert and oriented at baseline.    Assessment and Plan: 1. Dysmenorrhea - ibuprofen (ADVIL) 800 MG tablet; Take 1 tablet (800 mg total) by mouth every 8 (eight) hours as needed.  Dispense: 30 tablet; Refill: 0 - methocarbamol (ROBAXIN) 500 MG tablet; Take 1 tablet (500 mg total) by mouth 4 (four) times daily.  Dispense: 30 tablet; Refill: 0  2. Menorrhagia with irregular cycle  - Will give Ibuprofen 800mg  and robaxin for pain and cramping - May alternate ibuprofen and tylenol - Continue heating pad - Continue Norethindrone for now - Discussed adding Magnesium Glycinate 200-400mg  daily for menstrual  cramping - Call GYN in the morning  Follow Up Instructions: I discussed the assessment and treatment plan with the patient. The patient was provided an opportunity to ask questions and all were answered. The patient agreed with the plan and demonstrated an understanding of the instructions.  A copy of instructions were sent to the patient via MyChart unless otherwise noted below.    The patient was advised to call back or seek an in-person evaluation if the symptoms worsen or if the condition fails to improve as anticipated.  Time:  I spent 12 minutes with the patient via telehealth technology discussing the above problems/concerns.    , PA-C

## 2022-03-16 ENCOUNTER — Telehealth: Payer: Self-pay

## 2022-03-16 NOTE — Telephone Encounter (Signed)
Pt called our after hours nurse line 03/15/22. She did a video visit with a provider and was prescribed some meds. She is feeling better today.

## 2022-04-23 ENCOUNTER — Other Ambulatory Visit: Payer: Self-pay | Admitting: Obstetrics and Gynecology

## 2022-04-23 DIAGNOSIS — F419 Anxiety disorder, unspecified: Secondary | ICD-10-CM

## 2022-04-25 ENCOUNTER — Encounter: Payer: Self-pay | Admitting: Obstetrics and Gynecology

## 2022-04-26 ENCOUNTER — Other Ambulatory Visit: Payer: Self-pay | Admitting: Obstetrics and Gynecology

## 2022-04-26 DIAGNOSIS — I10 Essential (primary) hypertension: Secondary | ICD-10-CM

## 2022-04-26 MED ORDER — LISINOPRIL 10 MG PO TABS: 10.0000 mg | ORAL_TABLET | Freq: Every day | ORAL | 0 refills | Status: DC

## 2022-04-26 NOTE — Progress Notes (Signed)
Rx RF lisinopril till 7/23 annual.

## 2022-05-07 ENCOUNTER — Other Ambulatory Visit: Payer: Self-pay | Admitting: Obstetrics and Gynecology

## 2022-05-07 DIAGNOSIS — Z1231 Encounter for screening mammogram for malignant neoplasm of breast: Secondary | ICD-10-CM

## 2022-05-21 ENCOUNTER — Other Ambulatory Visit: Payer: Self-pay | Admitting: Obstetrics and Gynecology

## 2022-05-21 DIAGNOSIS — N921 Excessive and frequent menstruation with irregular cycle: Secondary | ICD-10-CM

## 2022-05-24 ENCOUNTER — Encounter: Payer: Self-pay | Admitting: Obstetrics and Gynecology

## 2022-05-24 ENCOUNTER — Other Ambulatory Visit: Payer: Self-pay | Admitting: Obstetrics and Gynecology

## 2022-05-24 ENCOUNTER — Ambulatory Visit (INDEPENDENT_AMBULATORY_CARE_PROVIDER_SITE_OTHER): Payer: BC Managed Care – PPO | Admitting: Obstetrics and Gynecology

## 2022-05-24 VITALS — BP 126/90 | Ht 65.0 in | Wt 210.0 lb

## 2022-05-24 DIAGNOSIS — Z01419 Encounter for gynecological examination (general) (routine) without abnormal findings: Secondary | ICD-10-CM

## 2022-05-24 DIAGNOSIS — F5101 Primary insomnia: Secondary | ICD-10-CM

## 2022-05-24 DIAGNOSIS — Z1211 Encounter for screening for malignant neoplasm of colon: Secondary | ICD-10-CM | POA: Diagnosis not present

## 2022-05-24 DIAGNOSIS — Z0000 Encounter for general adult medical examination without abnormal findings: Secondary | ICD-10-CM

## 2022-05-24 DIAGNOSIS — E782 Mixed hyperlipidemia: Secondary | ICD-10-CM

## 2022-05-24 DIAGNOSIS — N921 Excessive and frequent menstruation with irregular cycle: Secondary | ICD-10-CM

## 2022-05-24 DIAGNOSIS — Z13220 Encounter for screening for lipoid disorders: Secondary | ICD-10-CM

## 2022-05-24 DIAGNOSIS — F419 Anxiety disorder, unspecified: Secondary | ICD-10-CM

## 2022-05-24 DIAGNOSIS — Z1231 Encounter for screening mammogram for malignant neoplasm of breast: Secondary | ICD-10-CM | POA: Diagnosis not present

## 2022-05-24 DIAGNOSIS — I10 Essential (primary) hypertension: Secondary | ICD-10-CM

## 2022-05-24 DIAGNOSIS — Z1329 Encounter for screening for other suspected endocrine disorder: Secondary | ICD-10-CM

## 2022-05-24 MED ORDER — LISINOPRIL 10 MG PO TABS: 10.0000 mg | ORAL_TABLET | Freq: Every day | ORAL | 3 refills | Status: AC

## 2022-05-24 MED ORDER — TRAZODONE HCL 50 MG PO TABS: 50.0000 mg | ORAL_TABLET | Freq: Every evening | ORAL | 3 refills | Status: DC | PRN

## 2022-05-24 MED ORDER — NORETHINDRONE ACETATE 5 MG PO TABS: 5.0000 mg | ORAL_TABLET | Freq: Every day | ORAL | 0 refills | Status: AC

## 2022-05-24 NOTE — Progress Notes (Signed)
PCP:  Pcp, No   Chief Complaint  Patient presents with   Gynecologic Exam     HPI:      [name] is a 46 y.o. [phone] who LMP was No LMP recorded (lmp unknown)., presents today for her annual examination.  Her menses have been irregular since 2/23; used to be monthly, lasting 4-5 days. Had AUB 2/23 with increased dysmen and clots. Had GYN u/s 4/23 with EM=17 mm but similar for pt in 2021 when she had endometrial polyp. As of 4/23, menses were 25, 37, 41 days and bleeding was longer and heavier than used to be but no BTB. Pt started on aygestin 5 mg daily 4/23 since AUB resolved. Pt had heavy bleeding for 17 days 5/23 and has then had light spotting randomly since then. Had extreme pain with heavy days, improved with NSAIDs and muscle relaxer, but pain has resolved.  Hx of HTN, had lost IUD in past that had to be surgically removed so doesn't want another IUD. S/P TL. Had emotional sx with OCPs in adolescence. S/p hysteroscopy for polyp/ metrorrhagia, 5/20 with Dr. Tiburcio Pea  Sex activity: currently sex active; s/p tubal ligation. No pain/dryness Last Pap: 03/21/20 Results were negative; hx of ASCUS  /neg HPV DNA 2020  Hx of STDs: none  Last mammo: 02/16/21 Results: normal, repeat in 12 months. Has appt 8/23.  There is no FH of breast cancer. There is no FH of ovarian cancer. The patient does self-breast exams. She has sub-q scarring of LT breast at 10:00 position after horse bite. Pt had neg eval with bx in the past. No change in area per pt report.   Tobacco use: The patient denies current or previous tobacco use. Alcohol use: none No drug use.  Exercise: mod active  She does get adequate calcium and Vitamin D in her diet.  She takes lisinopril 10 mg daily for HTN with sx control. BPs are controlled. No side effects. Pt due for labs/Rx RF. Rx ran out a few months ago so stopped lisinopril but systolic BP 140-150, so pt restarted it.   She had elevated lipids 2020 till 5/22.  Suggested diet/exericse/wt loss changes, add fish oil. Due for repeat labs today.  Hx of anxiety, much improved now. Able to stop daily meds last yr and alprazolam.  Doing well.  Has seen psych off and on for many yrs. Takes trazodone nightly with sleep improvement. Needs RF.  Colonoscopy: never; had distant rectal bleeding with hemorrhoid in past. Saw GI who recommended increasing bulky stool with increased fiber intake.    Past Medical History:  Diagnosis Date   Anxiety    Essential hypertension 09/24/2017   External hemorrhoid    History of heavy vaginal bleeding    Obesity (BMI 35.0-39.9 without comorbidity)    PONV (postoperative nausea and vomiting)     Past Surgical History:  Procedure Laterality Date   BREAST BIOPSY Left    x2- neg / clips   CESAREAN SECTION     CYSTOSCOPY N/A 03/14/2015   Procedure: CYSTOSCOPY;  Surgeon: Conard Novak, MD;  Location: [facility];  Service: Gynecology;  Laterality: N/A;   HYSTEROSCOPY N/A 03/14/2015   Procedure: HYSTEROSCOPY;  Surgeon: Conard Novak, MD;  Location: [facility];  Service: Gynecology;  Laterality: N/A;   HYSTEROSCOPY WITH D & C N/A 03/31/2019   Procedure: DILATATION AND CURETTAGE /HYSTEROSCOPY POLYPECTOMY;  Surgeon: Nadara Mustard, MD;  Location: [facility];  Service: Gynecology;  Laterality:  N/A;   IUD REMOVAL N/A 03/14/2015   Procedure: INTRAUTERINE DEVICE (IUD) REMOVAL;  Surgeon: Conard Novak, MD;  Location: [facility];  Service: Gynecology;  Laterality: N/A;   LAPAROSCOPY N/A 03/14/2015   Procedure: LAPAROSCOPY DIAGNOSTIC;  Surgeon: Conard Novak, MD;  Location: [facility];  Service: Gynecology;  Laterality: N/A;   MM BREAST STEREO BIOPSY LEFT ([facility] HX)     [id number]   TUBAL LIGATION Bilateral 03/14/2015   Procedure: BILATERAL TUBAL LIGATION;  Surgeon: Conard Novak, MD;  Location: [facility];  Service: Gynecology;  Laterality: Bilateral;    Family History  Problem Relation Age of Onset   Skin cancer Paternal  Grandfather 61   Skin cancer Paternal Uncle 56   Breast cancer Neg Hx     Social History   Socioeconomic History   Marital status: Divorced    Spouse name: Not on file   Number of children: Not on file   Years of education: Not on file   Highest education level: Not on file  Occupational History   Not on file  Tobacco Use   Smoking status: Never   Smokeless tobacco: Never  Vaping Use   Vaping Use: Never used  Substance and Sexual Activity   Alcohol use: No   Drug use: No   Sexual activity: Yes    Birth control/protection: Surgical    Comment: Tubal ligation  Other Topics Concern   Not on file  Social History Narrative   Not on file   Social Determinants of Health   Financial Resource Strain: Not on file  Food Insecurity: Not on file  Transportation Needs: Not on file  Physical Activity: Not on file  Stress: Not on file  Social Connections: Not on file  Intimate Partner Violence: Not on file    Current Meds  Medication Sig   ALPRAZolam (XANAX) 0.5 MG tablet Take 1 tablet (0.5 mg total) by mouth 3 (three) times daily as needed for anxiety.   ibuprofen (ADVIL) 800 MG tablet Take 1 tablet (800 mg total) by mouth every 8 (eight) hours as needed.   VITAMIN D PO Take 1 tablet by mouth daily.   [DISCONTINUED] lisinopril (ZESTRIL) 10 MG tablet Take 1 tablet (10 mg total) by mouth daily.   [DISCONTINUED] norethindrone (AYGESTIN) 5 MG tablet Take 1 tablet (5 mg total) by mouth daily.   [DISCONTINUED] traZODone (DESYREL) 50 MG tablet TAKE 1 TABLET(50 MG) BY MOUTH AT BEDTIME     ROS:  Review of Systems  Constitutional:  Negative for fatigue, fever and unexpected weight change.  Respiratory:  Negative for cough, shortness of breath and wheezing.   Cardiovascular:  Negative for chest pain, palpitations and leg swelling.  Gastrointestinal:  Negative for blood in stool, constipation, diarrhea, nausea and vomiting.  Endocrine: Negative for cold intolerance, heat intolerance  and polyuria.  Genitourinary:  Positive for vaginal bleeding. Negative for dyspareunia, dysuria, flank pain, frequency, genital sores, hematuria, menstrual problem, pelvic pain, urgency, vaginal discharge and vaginal pain.  Musculoskeletal:  Negative for back pain, joint swelling and myalgias.  Skin:  Negative for rash.  Neurological:  Negative for dizziness, syncope, light-headedness, numbness and headaches.  Hematological:  Negative for adenopathy.  Psychiatric/Behavioral:  Negative for agitation, confusion, sleep disturbance and suicidal ideas. The patient is not nervous/anxious.   BREAST: LT breast horse bite scar  Objective: BP 126/90   Ht 5\' 5"  (1.651 m)   Wt 210 lb (95.3 kg)   LMP  (LMP Unknown)   BMI  34.95 kg/m    Physical Exam Constitutional:      Appearance: She is well-developed.  Genitourinary:     Vulva normal.     Right Labia: No rash, tenderness or lesions.    Left Labia: No tenderness, lesions or rash.    No vaginal discharge, erythema or tenderness.      Right Adnexa: not tender and no mass present.    Left Adnexa: not tender and no mass present.    No cervical motion tenderness, friability or polyp.     Uterus is not enlarged or tender.  Rectum:     External hemorrhoid present.  Breasts:    Right: No mass, nipple discharge, skin change or tenderness.     Left: Mass present. No nipple discharge, skin change or tenderness.  Neck:     Thyroid: No thyromegaly.  Cardiovascular:     Rate and Rhythm: Normal rate and regular rhythm.     Heart sounds: Normal heart sounds. No murmur heard. Pulmonary:     Effort: Pulmonary effort is normal.     Breath sounds: Normal breath sounds.  Chest:    Abdominal:     Palpations: Abdomen is soft.     Tenderness: There is no abdominal tenderness. There is no guarding or rebound.  Musculoskeletal:        General: Normal range of motion.     Cervical back: Normal range of motion.  Lymphadenopathy:     Cervical: No  cervical adenopathy.  Neurological:     General: No focal deficit present.     Mental Status: She is alert and oriented to person, place, and time.     Cranial Nerves: No cranial nerve deficit.  Skin:    General: Skin is warm and dry.  Psychiatric:        Mood and Affect: Mood normal.        Behavior: Behavior normal.        Thought Content: Thought content normal.        Judgment: Judgment normal.  Vitals reviewed.     Assessment/Plan: Encounter for annual routine gynecological examination  Encounter for screening mammogram for malignant neoplasm of breast--pt has 8/23 ap  Screening for colon cancer - Plan: Cologuard; colonoscopy/cologuard discussed. Pt elects cologuard. Ref sent. Will f/u with results.  Essential hypertension - Plan: Comprehensive metabolic panel, lisinopril (ZESTRIL) 10 MG tablet; doing well. Rx RF eRxd, check CMP.  Mixed hyperlipidemia - Plan: Lipid panel; check labs. Will f/u with results.   Primary insomnia - Plan: traZODone (DESYREL) 50 MG tablet; Rx RF. Takes nightly  Menorrhagia with irregular cycle - Plan: norethindrone (AYGESTIN) 5 MG tablet; Rx RF aygestin, RTO with MD for further eval/mgmt. Question SHGM vs hysteroscopy/D&C. EMB not done since on progesterone.  Blood tests for routine general physical examination - Plan: Comprehensive metabolic panel, Lipid panel  Screening cholesterol level - Plan: Lipid panel   Meds ordered this encounter  Medications   traZODone (DESYREL) 50 MG tablet    Sig: Take 1 tablet (50 mg total) by mouth at bedtime as needed for sleep.    Dispense:  90 tablet    Refill:  3    Order Specific Question:   Supervising Provider    Answer:   Hildred Laser [[id number]]   norethindrone (AYGESTIN) 5 MG tablet    Sig: Take 1 tablet (5 mg total) by mouth daily.    Dispense:  30 tablet    Refill:  0    Order Specific  Question:   Supervising Provider    Answer:   Hildred Laser [[id number]]   lisinopril (ZESTRIL) 10 MG tablet     Sig: Take 1 tablet (10 mg total) by mouth daily.    Dispense:  90 tablet    Refill:  3    Order Specific Question:   Supervising Provider    Answer:   Waymon Budge             GYN counsel adequate intake of calcium and vitamin D, diet and exercise     F/U  Return in about 1 year (around 05/25/2023).  Jaquelin Meaney B. Malaiah Viramontes, PA-C 05/24/2022 5:18 PM

## 2022-05-24 NOTE — Patient Instructions (Signed)
I value your feedback and you entrusting us with your care. If you get a [facility] patient survey, I would appreciate you taking the time to let us know about your experience today. Thank you! ? ? ?

## 2022-05-25 LAB — COMPREHENSIVE METABOLIC PANEL
ALT: 14 IU/L (ref 0–32)
AST: 16 IU/L (ref 0–40)
Albumin/Globulin Ratio: 1.9 (ref 1.2–2.2)
Albumin: 5 g/dL — ABNORMAL HIGH (ref 3.9–4.9)
Alkaline Phosphatase: 63 IU/L (ref 44–121)
BUN/Creatinine Ratio: 11 (ref 9–23)
BUN: 9 mg/dL (ref 6–24)
Bilirubin Total: 0.4 mg/dL (ref 0.0–1.2)
CO2: 17 mmol/L — ABNORMAL LOW (ref 20–29)
Calcium: 9.5 mg/dL (ref 8.7–10.2)
Chloride: 99 mmol/L (ref 96–106)
Creatinine, Ser: 0.84 mg/dL (ref 0.57–1.00)
Globulin, Total: 2.7 g/dL (ref 1.5–4.5)
Glucose: 72 mg/dL (ref 70–99)
Potassium: 4.2 mmol/L (ref 3.5–5.2)
Sodium: 137 mmol/L (ref 134–144)
Total Protein: 7.7 g/dL (ref 6.0–8.5)
eGFR: 87 mL/min/{1.73_m2} (ref >59)

## 2022-05-25 LAB — LIPID PANEL
Chol/HDL Ratio: 7.7 ratio — ABNORMAL HIGH (ref 0.0–4.4)
Cholesterol, Total: 239 mg/dL — ABNORMAL HIGH (ref 100–199)
HDL: 31 mg/dL — ABNORMAL LOW (ref >39)
LDL Chol Calc (NIH): 172 mg/dL — ABNORMAL HIGH (ref 0–99)
Triglycerides: 194 mg/dL — ABNORMAL HIGH (ref 0–149)
VLDL Cholesterol Cal: 36 mg/dL (ref 5–40)

## 2022-05-25 NOTE — Addendum Note (Signed)
Addended by: Althea Grimmer B on: 05/25/2022 01:38 PM   Modules accepted: Orders

## 2022-05-26 LAB — TSH: TSH: 1.47 u[IU]/mL (ref 0.450–4.500)

## 2022-05-26 LAB — SPECIMEN STATUS REPORT

## 2022-06-04 ENCOUNTER — Ambulatory Visit
Admission: RE | Admit: 2022-06-04 | Discharge: 2022-06-04 | Disposition: A | Discharge status: Home / Self Care | Payer: BC Managed Care – PPO | Source: Ambulatory Visit | Attending: Obstetrics and Gynecology | Admitting: Obstetrics and Gynecology

## 2022-06-04 DIAGNOSIS — Z1231 Encounter for screening mammogram for malignant neoplasm of breast: Secondary | ICD-10-CM | POA: Insufficient documentation

## 2022-06-06 ENCOUNTER — Encounter: Payer: Self-pay | Admitting: Obstetrics and Gynecology

## 2022-06-06 ENCOUNTER — Other Ambulatory Visit: Payer: Self-pay | Admitting: Obstetrics and Gynecology

## 2022-06-06 DIAGNOSIS — N6489 Other specified disorders of breast: Secondary | ICD-10-CM

## 2022-06-06 DIAGNOSIS — R928 Other abnormal and inconclusive findings on diagnostic imaging of breast: Secondary | ICD-10-CM

## 2022-06-14 ENCOUNTER — Telehealth: Payer: Self-pay | Admitting: Obstetrics and Gynecology

## 2022-06-14 LAB — COLOGUARD: COLOGUARD: NEGATIVE

## 2022-06-14 NOTE — Telephone Encounter (Signed)
Spoke with pt about lipid mgmt. She has appt with PCP to discuss/manage further.  Also discussed menometrorrhagia from 7/23. Pt on aygestin due to AUB from 3/23 with EM=17 mm. Pt's bleeding improved with aygestin, has occas spotting, no mood changes. Pt would like to stay on if possible. Discussed stopping aygestin and doing EMB if AUB recurs. If so and EMB neg, can restart aygestin. Hx of polyp in past and had suggested SHGM vs hyst/D&C at 7/23 appt. Pt prefers not to have surg procedure.

## 2022-06-28 ENCOUNTER — Ambulatory Visit
Admission: RE | Admit: 2022-06-28 | Discharge: 2022-06-28 | Disposition: A | Discharge status: Home / Self Care | Payer: BC Managed Care – PPO | Source: Ambulatory Visit | Attending: Obstetrics and Gynecology | Admitting: Obstetrics and Gynecology

## 2022-06-28 DIAGNOSIS — N6489 Other specified disorders of breast: Secondary | ICD-10-CM

## 2022-06-28 DIAGNOSIS — R928 Other abnormal and inconclusive findings on diagnostic imaging of breast: Secondary | ICD-10-CM

## 2022-07-05 ENCOUNTER — Encounter: Payer: Self-pay | Admitting: Obstetrics and Gynecology

## 2022-07-17 ENCOUNTER — Other Ambulatory Visit (HOSPITAL_COMMUNITY)
Admission: RE | Admit: 2022-07-17 | Discharge: 2022-07-17 | Disposition: A | Discharge status: Home / Self Care | Payer: BC Managed Care – PPO | Source: Ambulatory Visit | Attending: Obstetrics & Gynecology | Admitting: Obstetrics & Gynecology

## 2022-07-17 ENCOUNTER — Encounter: Payer: Self-pay | Admitting: Obstetrics & Gynecology

## 2022-07-17 ENCOUNTER — Ambulatory Visit: Payer: BC Managed Care – PPO | Admitting: Obstetrics & Gynecology

## 2022-07-17 VITALS — BP 110/70 | Ht 65.0 in | Wt 212.0 lb

## 2022-07-17 DIAGNOSIS — N809 Endometriosis, unspecified: Secondary | ICD-10-CM | POA: Diagnosis not present

## 2022-07-17 DIAGNOSIS — N921 Excessive and frequent menstruation with irregular cycle: Secondary | ICD-10-CM | POA: Diagnosis present

## 2022-07-17 NOTE — Progress Notes (Signed)
   Established Patient Office Visit  Subjective   Patient ID: Tonya Castro, female    DOB: [birth year]  Age: 46 y.o. MRN: [id number]  Chief Complaint  Patient presents with   Menstrual Problem    HPI    46 yo P2 is here with a long h/o DUB, during the last calendar year. She had a in office hysteroscopy with Dr. Kenton Kingfisher March 2020. A polyp was seen at that time. She also had a hysteroscopy in 2016 for IUD removal and a diagnostic laparoscopy at the same time. She uses tubal ligation for contraception. TSH and H/H were normal this month. She is interested in getting an endometrial ablation. One or two of her relatives had this and have been happy with the result.  Objective:     BP 110/70   Ht 5\' 5"  (1.651 m)   Wt 212 lb (96.2 kg)   LMP 07/17/2022 (Approximate) Comment: tubial ligation  BMI 35.28 kg/m    Physical Exam   Well nourished, well hydrated White female, no apparent distress She is ambulating and conversing normally.   UPT negative, consent signed, time out done Cervix prepped with betadine and sprayed with Hurricaine spray. I then grasped with a single tooth tenaculum. Uterus sounded to 9 cm Pipelle used for 1 pass with a large amount of tissue obtained. She tolerated the procedure well.   Assessment & Plan:   AUB  - await EMBX results. She will schedule an appt with Dr. Marcelline Mates or Dr Raenette Rover, MD

## 2022-07-18 ENCOUNTER — Encounter: Payer: Self-pay | Admitting: Obstetrics & Gynecology

## 2022-07-19 LAB — SURGICAL PATHOLOGY

## 2022-08-28 ENCOUNTER — Encounter: Payer: Self-pay | Admitting: Obstetrics and Gynecology

## 2022-10-01 ENCOUNTER — Encounter: Payer: Self-pay | Admitting: Obstetrics and Gynecology

## 2022-10-01 ENCOUNTER — Other Ambulatory Visit: Payer: Self-pay | Admitting: Obstetrics and Gynecology

## 2022-10-01 DIAGNOSIS — F5101 Primary insomnia: Secondary | ICD-10-CM

## 2022-10-01 MED ORDER — TRAZODONE HCL 100 MG PO TABS: 100.0000 mg | ORAL_TABLET | Freq: Every evening | ORAL | 1 refills | Status: AC | PRN

## 2022-10-01 NOTE — Progress Notes (Signed)
Rx RF trazodone. Dose increased to 100 mg QHS. Annual due 7/24

## 2023-03-28 ENCOUNTER — Other Ambulatory Visit: Payer: Self-pay | Admitting: Obstetrics and Gynecology

## 2023-03-28 DIAGNOSIS — F5101 Primary insomnia: Secondary | ICD-10-CM

## 2023-06-22 ENCOUNTER — Other Ambulatory Visit: Payer: Self-pay | Admitting: Obstetrics and Gynecology

## 2023-06-22 DIAGNOSIS — I10 Essential (primary) hypertension: Secondary | ICD-10-CM

## 2024-02-19 ENCOUNTER — Other Ambulatory Visit: Payer: Self-pay

## 2024-02-19 ENCOUNTER — Emergency Department
Admission: EM | Admit: 2024-02-19 | Discharge: 2024-02-19 | Disposition: A | Discharge status: Home / Self Care | Attending: Emergency Medicine | Admitting: Emergency Medicine

## 2024-02-19 ENCOUNTER — Emergency Department

## 2024-02-19 ENCOUNTER — Encounter: Payer: Self-pay | Admitting: Emergency Medicine

## 2024-02-19 DIAGNOSIS — R102 Pelvic and perineal pain: Secondary | ICD-10-CM | POA: Diagnosis present

## 2024-02-19 DIAGNOSIS — Z79899 Other long term (current) drug therapy: Secondary | ICD-10-CM | POA: Diagnosis not present

## 2024-02-19 DIAGNOSIS — I10 Essential (primary) hypertension: Secondary | ICD-10-CM | POA: Insufficient documentation

## 2024-02-19 LAB — BASIC METABOLIC PANEL WITH GFR
Anion gap: 7 (ref 5–15)
BUN: 13 mg/dL (ref 6–20)
CO2: 19 mmol/L — ABNORMAL LOW (ref 22–32)
Calcium: 8.7 mg/dL — ABNORMAL LOW (ref 8.9–10.3)
Chloride: 110 mmol/L (ref 98–111)
Creatinine, Ser: 0.92 mg/dL (ref 0.44–1.00)
GFR, Estimated: >60 mL/min (ref >60)
Glucose, Bld: 101 mg/dL — ABNORMAL HIGH (ref 70–99)
Potassium: 4.3 mmol/L (ref 3.5–5.1)
Sodium: 136 mmol/L (ref 135–145)

## 2024-02-19 LAB — CBC WITH DIFFERENTIAL/PLATELET
Abs Immature Granulocytes: 0.05 10*3/uL (ref 0.00–0.07)
Basophils Absolute: 0.1 10*3/uL (ref 0.0–0.1)
Basophils Relative: 0 %
Eosinophils Absolute: 0.1 10*3/uL (ref 0.0–0.5)
Eosinophils Relative: 1 %
HCT: 39.7 % (ref 36.0–46.0)
Hemoglobin: 13.2 g/dL (ref 12.0–15.0)
Immature Granulocytes: 0 %
Lymphocytes Relative: 30 %
Lymphs Abs: 3.5 10*3/uL (ref 0.7–4.0)
MCH: 31 pg (ref 26.0–34.0)
MCHC: 33.2 g/dL (ref 30.0–36.0)
MCV: 93.2 fL (ref 80.0–100.0)
Monocytes Absolute: 0.7 10*3/uL (ref 0.1–1.0)
Monocytes Relative: 6 %
Neutro Abs: 7.4 10*3/uL (ref 1.7–7.7)
Neutrophils Relative %: 63 %
Platelets: 264 10*3/uL (ref 150–400)
RBC: 4.26 MIL/uL (ref 3.87–5.11)
RDW: 13.1 % (ref 11.5–15.5)
WBC: 11.8 10*3/uL — ABNORMAL HIGH (ref 4.0–10.5)
nRBC: 0 % (ref 0.0–0.2)

## 2024-02-19 LAB — URINALYSIS, ROUTINE W REFLEX MICROSCOPIC
Bilirubin Urine: NEGATIVE
Glucose, UA: NEGATIVE mg/dL
Ketones, ur: NEGATIVE mg/dL
Leukocytes,Ua: NEGATIVE
Nitrite: NEGATIVE
Protein, ur: NEGATIVE mg/dL
Specific Gravity, Urine: 1.016 (ref 1.005–1.030)
pH: 5 (ref 5.0–8.0)

## 2024-02-19 LAB — POC URINE PREG, ED: Preg Test, Ur: NEGATIVE

## 2024-02-19 MED ORDER — HYDROCODONE-ACETAMINOPHEN 5-325 MG PO TABS: 1.0000 | ORAL_TABLET | Freq: Four times a day (QID) | ORAL | 0 refills | Status: AC | PRN

## 2024-02-19 MED ORDER — ONDANSETRON HCL 4 MG/2ML IJ SOLN
4.0000 mg | Freq: Once | INTRAMUSCULAR | Status: AC
  Administered 2024-02-19: 4 mg via INTRAVENOUS
  Filled 2024-02-19: qty 2

## 2024-02-19 MED ORDER — SODIUM CHLORIDE 0.9 % IV BOLUS
500.0000 mL | Freq: Once | INTRAVENOUS | Status: AC
  Administered 2024-02-19: 500 mL via INTRAVENOUS

## 2024-02-19 MED ORDER — KETOROLAC TROMETHAMINE 60 MG/2ML IM SOLN
30.0000 mg | Freq: Once | INTRAMUSCULAR | Status: AC
  Administered 2024-02-19: 30 mg via INTRAMUSCULAR
  Filled 2024-02-19: qty 2

## 2024-02-19 MED ORDER — ONDANSETRON 4 MG PO TBDP: 4.0000 mg | ORAL_TABLET | Freq: Three times a day (TID) | ORAL | 0 refills | Status: AC | PRN

## 2024-02-19 NOTE — ED Provider Notes (Signed)
 [facility] Provider Note    Event Date/Time   First MD Initiated Contact with Patient 02/19/24 [phone]     (approximate)   History   Pelvic Pain   HPI  Tonya Castro is a 48 y.o. female who presents to the ED from home with a chief complaint of lower suprapubic/pelvic pain which woke her up from sleep approximately 1 hour prior to arrival.  History of IUD complication, currently without IUD.  Also history of kidney stones.  Denies fever/chills, chest pain, shortness of breath, flank pain, hematuria, vaginal bleeding or discharge.     Past Medical History   Past Medical History:  Diagnosis Date   Anxiety    Essential hypertension 09/24/2017   External hemorrhoid    History of heavy vaginal bleeding    Obesity (BMI 35.0-39.9 without comorbidity)    PONV (postoperative nausea and vomiting)      Active Problem List   Patient Active Problem List   Diagnosis Date Noted   Abnormal uterine bleeding (AUB) 12/28/2021   Mixed hyperlipidemia 03/22/2021   Primary insomnia 03/22/2021   Anxiety 03/21/2020   ASCUS of cervix with negative high risk HPV 03/21/2020   Endometrial polyp 03/31/2019   Metrorrhagia 01/13/2019   Endometrial thickening on ultrasound 01/13/2019   Essential hypertension 09/24/2017   Malpositioned intrauterine device 03/14/2015   Obesity (BMI 35.0-39.9 without comorbidity) 03/14/2015   IUD complication (HCC) 03/14/2015   Abdominal pain, acute 03/14/2015   Pelvic pain in female 03/14/2015     Past Surgical History   Past Surgical History:  Procedure Laterality Date   BREAST BIOPSY Left    x2- neg / clips   CESAREAN SECTION     CYSTOSCOPY N/A 03/14/2015   Procedure: CYSTOSCOPY;  Surgeon: Kris Pester, MD;  Location: [facility];  Service: Gynecology;  Laterality: N/A;   HYSTEROSCOPY N/A 03/14/2015   Procedure: HYSTEROSCOPY;  Surgeon: Kris Pester, MD;  Location: [facility];  Service: Gynecology;  Laterality: N/A;    HYSTEROSCOPY WITH D & C N/A 03/31/2019   Procedure: DILATATION AND CURETTAGE /HYSTEROSCOPY POLYPECTOMY;  Surgeon: Alben Alma, MD;  Location: [facility];  Service: Gynecology;  Laterality: N/A;   IUD REMOVAL N/A 03/14/2015   Procedure: INTRAUTERINE DEVICE (IUD) REMOVAL;  Surgeon: Kris Pester, MD;  Location: [facility];  Service: Gynecology;  Laterality: N/A;   LAPAROSCOPY N/A 03/14/2015   Procedure: LAPAROSCOPY DIAGNOSTIC;  Surgeon: Kris Pester, MD;  Location: [facility];  Service: Gynecology;  Laterality: N/A;   MM BREAST STEREO BIOPSY LEFT ([facility] HX)     [id number]   TUBAL LIGATION Bilateral 03/14/2015   Procedure: BILATERAL TUBAL LIGATION;  Surgeon: Kris Pester, MD;  Location: [facility];  Service: Gynecology;  Laterality: Bilateral;     Home Medications   Prior to Admission medications   Medication Sig Start Date End Date Taking? Authorizing Provider  ALPRAZolam  (XANAX ) 0.5 MG tablet Take 1 tablet (0.5 mg total) by mouth 3 (three) times daily as needed for anxiety. 03/21/20   Copland, Alicia B, PA-C  ibuprofen  (ADVIL ) 800 MG tablet Take 1 tablet (800 mg total) by mouth every 8 (eight) hours as needed. 03/15/22   Angelia Kelp, PA-C  lisinopril  (ZESTRIL ) 10 MG tablet Take 1 tablet (10 mg total) by mouth daily. 05/24/22   Copland, Alicia B, PA-C  norethindrone  (AYGESTIN ) 5 MG tablet Take 1 tablet (5 mg total) by mouth daily. Patient not taking: Reported on 07/17/2022 05/24/22   Copland,  Alicia B, PA-C  traZODone  (DESYREL ) 100 MG tablet Take 1 tablet (100 mg total) by mouth at bedtime as needed for sleep. 10/01/22   Copland, Alicia B, PA-C  VITAMIN D PO Take 1 tablet by mouth daily. Patient not taking: Reported on 07/17/2022    [provider]     Allergies  Amoxicillin and Vancomycin   Family History   Family History  Problem Relation Age of Onset   Skin cancer Paternal Grandfather 9   Skin cancer Paternal Uncle 59   Breast cancer Neg Hx      Physical  Exam  Triage Vital Signs: ED Triage Vitals  Encounter Vitals Group     BP --      Systolic BP Percentile --      Diastolic BP Percentile --      Pulse --      Resp --      Temp --      Temp src --      SpO2 --      Weight 02/19/24 0046 220 lb (99.8 kg)     Height 02/19/24 0046 5\' 5"  (1.651 m)     Head Circumference --      Peak Flow --      Pain Score 02/19/24 0045 4     Pain Loc --      Pain Education --      Exclude from Growth Chart --     Updated Vital Signs: BP (!) 155/86 (BP Location: Right Arm)   Pulse (!) 115   Temp 98 F (36.7 C) (Oral)   Resp 18   Ht 5\' 5"  (1.651 m)   Wt 99.8 kg   SpO2 99%   BMI 36.61 kg/m    General: Awake, mild distress.  CV:  RRR.  Good peripheral perfusion.  Resp:  Normal effort.  CTAB. Abd:  Mild suprapubic tenderness to palpation without rebound or guarding.  No CVAT.  No distention.  Other:  No truncal vesicles.   ED Results / Procedures / Treatments  Labs (all labs ordered are listed, but only abnormal results are displayed) Labs Reviewed  URINALYSIS, ROUTINE W REFLEX MICROSCOPIC - Abnormal; Notable for the following components:      Result Value   Color, Urine YELLOW (*)    APPearance CLOUDY (*)    Hgb urine dipstick MODERATE (*)    Bacteria, UA RARE (*)    All other components within normal limits  CBC WITH DIFFERENTIAL/PLATELET - Abnormal; Notable for the following components:   WBC 11.8 (*)    All other components within normal limits  BASIC METABOLIC PANEL WITH GFR - Abnormal; Notable for the following components:   CO2 19 (*)    Glucose, Bld 101 (*)    Calcium 8.7 (*)    All other components within normal limits  POC URINE PREG, ED - Normal     EKG  None   RADIOLOGY  CT renal stone study: No acute abnormalities, left renal calculi  Official radiology report(s): CT Renal Stone Study Result Date: 02/19/2024 CLINICAL DATA:  Abdominal/flank pain EXAM: CT ABDOMEN AND PELVIS WITHOUT CONTRAST TECHNIQUE:  Multidetector CT imaging of the abdomen and pelvis was performed following the standard protocol without IV contrast. RADIATION DOSE REDUCTION: This exam was performed according to the departmental dose-optimization program which includes automated exposure control, adjustment of the mA and/or kV according to patient size and/or use of iterative reconstruction technique. COMPARISON:  03/14/2015 FINDINGS: Lower Chest: Normal. Hepatobiliary:  Normal hepatic contours. No intra- or extrahepatic biliary dilatation. The gallbladder is normal. Pancreas: Normal pancreas. No ductal dilatation or peripancreatic fluid collection. Spleen: Calcified splenic granulomata. Adrenals/Urinary Tract: The adrenal glands are normal. Multiple nonobstructing left renal calculi measuring up to 6 mm. No hydroureteronephrosis. Normal right kidney. The urinary bladder is normal for degree of distention Stomach/Bowel: There is no hiatal hernia. Normal duodenal course and caliber. No small bowel dilatation or inflammation. No focal colonic abnormality. Normal appendix. Vascular/Lymphatic: Normal course and caliber of the major abdominal vessels. No abdominal or pelvic lymphadenopathy. Reproductive: Normal uterus. No adnexal mass. Other: None. Musculoskeletal: No bony spinal canal stenosis or focal osseous abnormality. IMPRESSION: Multiple nonobstructing left renal calculi measuring up to 6 mm. No hydroureteronephrosis. Electronically Signed   By: Juanetta Nordmann M.D.   On: 02/19/2024 03:57     PROCEDURES:  Critical Care performed: No  Procedures   MEDICATIONS ORDERED IN ED: Medications  ketorolac  (TORADOL ) injection 30 mg (30 mg Intramuscular Given 02/19/24 0152)  sodium chloride  0.9 % bolus 500 mL (0 mLs Intravenous Stopped 02/19/24 0300)  ondansetron  (ZOFRAN ) injection 4 mg (4 mg Intravenous Given 02/19/24 0402)     IMPRESSION / MDM / ASSESSMENT AND PLAN / ED COURSE  I reviewed the triage vital signs and the nursing notes.                              48 year old female presenting with low suprapubic/pelvic pain. Differential diagnosis includes, but is not limited to, ovarian cyst, ovarian torsion, acute appendicitis, diverticulitis, urinary tract infection/pyelonephritis, endometriosis, bowel obstruction, colitis, renal colic, gastroenteritis, hernia, fibroids, endometriosis, pregnancy related pain including ectopic pregnancy, etc. I personally reviewed patient's records and note a PCP office visit from 08/23/2023 for physical exam, follow-up hypertension, hyperlipidemia, anxiety and obesity.  Patient's presentation is most consistent with acute, uncomplicated illness.  Awaiting UA results.  Administer IM ketorolac  and reassess.  Clinical Course as of 02/19/24 0426  Wed Feb 19, 2024  0239 UA demonstrates microscopic hematuria.  Will obtain basic lab work, initiate IV fluids, obtain CT renal colic study. [JS]  0425 Patient feeling better.  Updated her on laboratory and imaging results.  Will discharge home with as needed prescriptions and patient will follow-up closely with her PCP.  Strict return precautions given.  Patient verbalizes understanding and agrees with plan of care. [JS]    Clinical Course User Index [JS] Norlene Beavers, MD     FINAL CLINICAL IMPRESSION(S) / ED DIAGNOSES   Final diagnoses:  Pelvic pain in female  Suprapubic pain     Rx / DC Orders   ED Discharge Orders     None        Note:  This document was prepared using Dragon voice recognition software and may include unintentional dictation errors.   Danaja Lasota J, MD 02/19/24 [phone]

## 2024-02-19 NOTE — Discharge Instructions (Signed)
 You may take pain and nausea medicines as needed.  Drink plenty of fluids daily.  Return to the ER for worsening symptoms, persistent vomiting, difficulty breathing or other concerns.

## 2024-02-19 NOTE — ED Triage Notes (Signed)
 Patient ambulatory to triage with steady gait, without difficulty or distress noted; pt reports possible bladder infection; c/o pelvic pain that began tonight

## 2024-08-17 ENCOUNTER — Other Ambulatory Visit: Payer: Self-pay | Admitting: Student

## 2024-08-17 DIAGNOSIS — Z1231 Encounter for screening mammogram for malignant neoplasm of breast: Secondary | ICD-10-CM

## 2024-09-28 ENCOUNTER — Ambulatory Visit
Admission: RE | Admit: 2024-09-28 | Discharge: 2024-09-28 | Disposition: A | Source: Ambulatory Visit | Attending: Student

## 2024-09-28 DIAGNOSIS — Z1231 Encounter for screening mammogram for malignant neoplasm of breast: Secondary | ICD-10-CM | POA: Diagnosis present

## 2024-09-29 ENCOUNTER — Other Ambulatory Visit: Payer: Self-pay | Admitting: *Deleted

## 2024-09-29 ENCOUNTER — Inpatient Hospital Stay
Admission: RE | Admit: 2024-09-29 | Discharge: 2024-09-29 | Disposition: A | Payer: Self-pay | Source: Ambulatory Visit | Attending: Student

## 2024-09-29 DIAGNOSIS — Z1231 Encounter for screening mammogram for malignant neoplasm of breast: Secondary | ICD-10-CM

## 2024-10-02 ENCOUNTER — Encounter: Payer: Self-pay | Admitting: Student

## 2024-10-19 ENCOUNTER — Other Ambulatory Visit: Payer: Self-pay | Admitting: Student

## 2024-10-19 DIAGNOSIS — R928 Other abnormal and inconclusive findings on diagnostic imaging of breast: Secondary | ICD-10-CM

## 2024-10-30 ENCOUNTER — Ambulatory Visit
Admission: RE | Admit: 2024-10-30 | Discharge: 2024-10-30 | Disposition: A | Discharge status: Home / Self Care | Source: Ambulatory Visit | Attending: Student | Admitting: Student

## 2024-10-30 DIAGNOSIS — R928 Other abnormal and inconclusive findings on diagnostic imaging of breast: Secondary | ICD-10-CM
